# Patient Record
Sex: Female | Born: 1955
Health system: Southern US, Community
[De-identification: ages and names within clinical notes are randomized; demographics above are authoritative.]

## PROBLEM LIST (undated history)

## (undated) DIAGNOSIS — I10 Essential (primary) hypertension: Secondary | ICD-10-CM

## (undated) HISTORY — DX: Essential (primary) hypertension: I10

---

## 1998-11-22 HISTORY — PX: OVARIAN CYST REMOVAL: SHX89

## 2015-02-21 ENCOUNTER — Ambulatory Visit (INDEPENDENT_AMBULATORY_CARE_PROVIDER_SITE_OTHER): Payer: 59 | Admitting: Family Medicine

## 2015-02-21 VITALS — BP 112/68 | HR 58 | Temp 98.2°F | Resp 18 | Ht 60.5 in | Wt 154.0 lb

## 2015-02-21 DIAGNOSIS — I10 Essential (primary) hypertension: Secondary | ICD-10-CM

## 2015-02-21 DIAGNOSIS — J029 Acute pharyngitis, unspecified: Secondary | ICD-10-CM

## 2015-02-21 DIAGNOSIS — Z789 Other specified health status: Secondary | ICD-10-CM | POA: Diagnosis not present

## 2015-02-21 DIAGNOSIS — Z758 Other problems related to medical facilities and other health care: Secondary | ICD-10-CM | POA: Insufficient documentation

## 2015-02-21 LAB — POCT RAPID STREP A (OFFICE): RAPID STREP A SCREEN: NEGATIVE

## 2015-02-21 MED ORDER — BISOPROLOL-HYDROCHLOROTHIAZIDE 5-6.25 MG PO TABS: 1.0000 | ORAL_TABLET | Freq: Every day | ORAL | Status: DC

## 2015-02-21 MED ORDER — PENICILLIN V POTASSIUM 500 MG PO TABS: 500.0000 mg | ORAL_TABLET | Freq: Two times a day (BID) | ORAL | Status: DC

## 2015-02-21 NOTE — Addendum Note (Signed)
Addended by: Abbe AmsterdamOPLAND, JESSICA C on: 02/21/2015 11:47 AM   Modules accepted: Orders

## 2015-02-21 NOTE — Patient Instructions (Signed)
You may have strep throat, although your rapid strep is negative.  We are going to treat you with penicillin (antibiotic) Also use throat lozenges as needed, and take ibuprofen and/ or tylenol Let me know if you do not feel better in the next few days- Sooner if worse.

## 2015-02-21 NOTE — Progress Notes (Addendum)
[facility] [address], [place] [id number] [phone]  Date:  02/21/2015   Name:  Alyssa CannyMARIA R Walker   DOB:  04/26/1956   MRN:  [id number]  PCP:  No PCP Per Patient    Chief Complaint: Headache; Neck Pain; and Otalgia   History of Present Illness:  Alyssa Walker is a 59 y.o. very pleasant female patient who presents with the following:  She notes pain in her head, face and neck for about 3 days.  Her ears have been hurting- sometimes one, sometimes the other.  Also her throat hurts.  She does not have a "headache" in the typical sense but just notes pain in the areas described above No fever, no cough.  No GI symptoms.  No sneezing.   She takes medication for HTN.   Her nose is not really bothering her.  No itchy, runny or stuffy nose  There are no active problems to display for this patient.   Past Medical History  Diagnosis Date  . Hypertension     Past Surgical History  Procedure Laterality Date  . Abdominal hysterectomy    . Coronary artery bypass graft      History  Substance Use Topics  . Smoking status: Never Smoker   . Smokeless tobacco: Not on file  . Alcohol Use: No    History reviewed. No pertinent family history.  No Known Allergies  Medication list has been reviewed and updated.  No current outpatient prescriptions on file prior to visit.   No current facility-administered medications on file prior to visit.    Review of Systems:  As per HPI- otherwise negative.   Physical Examination: Filed Vitals:   02/21/15 1028  BP: 112/68  Pulse: 58  Temp: 98.2 F (36.8 C)  Resp: 18   Filed Vitals:   02/21/15 1028  Height: 5' 0.5" (1.537 m)  Weight: 154 lb (69.854 kg)   Body mass index is 29.57 kg/(m^2). Ideal Body Weight: Weight in (lb) to have BMI = 25: 129.9  GEN: WDWN, NAD, Non-toxic, A & O x 3, overweight, looks well HEENT: Atraumatic, Normocephalic. Neck supple. No masses, No LAD.  Bilateral TM wnl,  oropharynx shows inflamed and enlarged tonsils, blood on strep swab. Nasal cavity WNL.   PEERL,EOMI.   Ears and Nose: No external deformity. CV: RRR, No M/G/R. No JVD. No thrill. No extra heart sounds. PULM: CTA B, no wheezes, crackles, rhonchi. No retractions. No resp. distress. No accessory muscle use. ABD: S, NT, ND EXTR: No c/c/e NEURO Normal gait.  PSYCH: Normally interactive. Conversant. Not depressed or anxious appearing.  Calm demeanor.    Assessment and Plan: Acute pharyngitis, unspecified pharyngitis type - Plan: POCT rapid strep A, penicillin v potassium (VEETID) 500 MG tablet  Suspect strep or other bacterial pharyngitis.  Treat with penicillin, she will let me know if not better in the next couple of days  Signed Abbe AmsterdamJessica Copland, MD  Pharmacy called after visit- pt requesting a RF of her BP medication.  I am not treating her HTN and do not have labs, but will give her one month so she can follow-up with her doctor

## 2015-10-05 ENCOUNTER — Ambulatory Visit (INDEPENDENT_AMBULATORY_CARE_PROVIDER_SITE_OTHER): Payer: 59

## 2015-10-05 ENCOUNTER — Ambulatory Visit (INDEPENDENT_AMBULATORY_CARE_PROVIDER_SITE_OTHER): Payer: 59 | Admitting: Urgent Care

## 2015-10-05 VITALS — BP 140/88 | HR 68 | Temp 98.7°F | Resp 14 | Ht 60.0 in | Wt 153.0 lb

## 2015-10-05 DIAGNOSIS — M545 Low back pain: Secondary | ICD-10-CM

## 2015-10-05 MED ORDER — CYCLOBENZAPRINE HCL 10 MG PO TABS: 5.0000 mg | ORAL_TABLET | Freq: Every day | ORAL | Status: DC

## 2015-10-05 MED ORDER — MELOXICAM 15 MG PO TABS: 7.5000 mg | ORAL_TABLET | Freq: Every day | ORAL | Status: DC

## 2015-10-05 NOTE — Patient Instructions (Signed)
Dolor de espalda en adultos  (Back Pain, Adult)  El dolor de espalda es muy frecuente en los adultos. La causa del dolor de espalda es rara vez peligrosa y el dolor a menudo mejora con el tiempo. Es posible que se desconozca la causa de esta afección. Algunas causas comunes son las siguientes:  · Distensión de los músculos o ligamentos que sostienen la columna vertebral.  · Desgaste (degeneración) de los discos vertebrales.  · Artritis.  · Lesiones directas en la espalda.  En muchas personas, el dolor de espalda es recurrente. Como rara vez es peligroso, las personas pueden aprender a manejar esta afección por sí mismas.  INSTRUCCIONES PARA EL CUIDADO EN EL HOGAR  Controle su dolor de espalda a fin de detectar algún cambio. Las siguientes indicaciones ayudarán a aliviar cualquier molestia que pueda sentir:  · Permanezca activo. Si permanece sentado o de pie en un mismo lugar durante mucho tiempo, se tensiona la espalda. No se siente, conduzca o permanezca de pie en un mismo lugar durante más de 30 minutos seguidos. Realice caminatas cortas en superficies planas tan pronto como le sea posible. Trate de caminar un poco más de tiempo cada día.  · Haga ejercicio regularmente como se lo haya indicado el médico. El ejercicio ayuda a que su espalda se cure más rápidamente. También ayuda a prevenir futuras lesiones al mantener los músculos fuertes y flexibles.  · No permanezca en la cama. Si hace reposo más de 1 a 2 días, puede demorar su recuperación.  · Preste atención a su cuerpo al inclinarse y levantarse. Las posiciones más cómodas son las que ejercen menos tensión en la espalda en recuperación. Siempre use técnicas apropiadas para levantar objetos, como por ejemplo:    Flexionar las rodillas.    Mantener la carga cerca del cuerpo.    No torcerse.  · Encuentre una posición cómoda para dormir. Use un colchón firme y recuéstese de costado con las rodillas ligeramente flexionadas. Si se recuesta sobre la espalda, coloque  una almohada debajo de las rodillas.  · Evite sentir ansiedad o estrés. El estrés aumenta la tensión muscular y puede empeorar el dolor de espalda. Es importante reconocer si se siente ansioso o estresado y aprender maneras de controlarlo, por ejemplo haciendo ejercicio.  · Tome los medicamentos solamente como se lo haya indicado el médico. Los medicamentos de venta libre para aliviar el dolor y la inflamación a menudo son los más eficaces. El médico puede recetarle relajantes musculares. Estos medicamentos ayudan a calmar el dolor de modo que pueda reanudar más rápidamente sus actividades normales y el ejercicio saludable.  · Aplique hielo sobre la zona lesionada.    Ponga el hielo en una bolsa plástica.    Coloque una toalla entre la piel y la bolsa de hielo.    Deje el hielo durante 20 minutos, 2 a 3 veces por día, durante los primeros 2 o 3 días. Después de eso, puede alternar el hielo y el calor para reducir el dolor y los espasmos.  · Mantenga un peso saludable. El exceso de peso ejerce presión adicional sobre la espalda y hace que resulte difícil mantener una buena postura.  SOLICITE ATENCIÓN MÉDICA SI:  · Siente un dolor que no se alivia con reposo o medicamentos.  · Siente mucho dolor que se extiende a las piernas o los glúteos.  · El dolor no mejora en una semana.  · Siente dolor por la noche.  · Pierde peso.  · Siente escalofríos o fiebre.  SOLICITE ATENCIÓN MÉDICA DE INMEDIATO SI:   ·   Tiene nuevos problemas para controlar la vejiga o los intestinos.  · Siente debilidad o adormecimiento inusuales en los brazos o en las piernas.  · Siente náuseas o vómitos.  · Siente dolor abdominal.  · Siente que va a desmayarse.     Esta información no tiene como fin reemplazar el consejo del médico. Asegúrese de hacerle al médico cualquier pregunta que tenga.     Document Released: 11/08/2005 Document Revised: 11/29/2014  Elsevier Interactive Patient Education ©2016 Elsevier Inc.

## 2015-10-05 NOTE — Progress Notes (Signed)
    MRN: [id number] DOB: 03/09/1956  Subjective:   Alyssa Walker is a 59 y.o. female presenting for chief complaint of Back Pain  Reports several year history of intermittent low back pain. Pain is achy and intermittently sharp, now occuring daily, worse in the mornings, radiates down her right leg. Has previously received injections in her low back with significant relief. Lost follow up with the physician who was doing this for her so she has been using Advil lately without any relief. Patient works in Dietitiancleaning services. Denies fever, numbness or tingling, incontinence, trauma, constipation. Denies any other aggravating or relieving factors, no other questions or concerns.  Alyssa Walker has a current medication list which includes the following prescription(s): bisoprolol-hydrochlorothiazide and penicillin v potassium. Also has No Known Allergies.  Alyssa Walker  has a past medical history of Hypertension. Also  has past surgical history that includes Abdominal hysterectomy and Coronary artery bypass graft.  Objective:   Vitals: BP 140/88 mmHg  Pulse 68  Temp(Src) 98.7 F (37.1 C) (Oral)  Resp 14  Ht 5' (1.524 m)  Wt 153 lb (69.4 kg)  BMI 29.88 kg/m2  SpO2 97%  Physical Exam  Constitutional: She is oriented to person, place, and time. She appears well-developed and well-nourished.  Cardiovascular: Normal rate.   Pulmonary/Chest: Effort normal.  Abdominal: Soft. Bowel sounds are normal. She exhibits no distension and no mass. There is no tenderness.  Musculoskeletal:       Lumbar back: She exhibits decreased range of motion (extension), tenderness (at midline as depicted and over right upper gluteus) and spasm. She exhibits no bony tenderness, no swelling, no edema, no deformity and no laceration.       Back:  Neurological: She is alert and oriented to person, place, and time.  Skin: Skin is warm and dry. No rash noted. No erythema. No pallor.   [facility] reading (PRIMARY) by  Dr. Cleta Albertsaub and  PA-Ripley Bogosian. Lumbar - Degenerative changes in facets at L5-S1 which may be appropriate for her age. No acute process.  Dg Lumbar Spine Complete  10/05/2015  CLINICAL DATA:  59 year old female with lumbar spine pain. EXAM: LUMBAR SPINE - COMPLETE 4+ VIEW COMPARISON:  None. FINDINGS: Five non rib-bearing lumbar type vertebra are identified in normal alignment. There is no evidence of acute fracture or subluxation. Mild multilevel degenerative disc disease noted. No focal bony lesions or spondylolysis noted. Cholecystectomy clips are present. IMPRESSION: Mild multilevel degenerative disc disease without other significant abnormality. Electronically Signed   By: Harmon PierJeffrey  Hu M.D.   On: 10/05/2015 09:44   Assessment and Plan :   1. Low back pain, unspecified back pain laterality, with sciatica presence unspecified - Counseled on early signs of arthritis, start meloxicam and flexeril for pain and inflammation. Consider referral to ortho for further management.  Wallis BambergMario Kennadi Albany, PA-C [facility] [phone] 10/05/2015 9:02 AM

## 2015-11-02 ENCOUNTER — Ambulatory Visit (INDEPENDENT_AMBULATORY_CARE_PROVIDER_SITE_OTHER): Payer: 59 | Admitting: Urgent Care

## 2015-11-02 ENCOUNTER — Encounter: Payer: Self-pay | Admitting: Urgent Care

## 2015-11-02 VITALS — BP 148/90 | HR 65 | Temp 97.8°F | Ht 60.0 in | Wt 150.0 lb

## 2015-11-02 DIAGNOSIS — M51369 Other intervertebral disc degeneration, lumbar region without mention of lumbar back pain or lower extremity pain: Secondary | ICD-10-CM

## 2015-11-02 DIAGNOSIS — I10 Essential (primary) hypertension: Secondary | ICD-10-CM

## 2015-11-02 DIAGNOSIS — M542 Cervicalgia: Secondary | ICD-10-CM

## 2015-11-02 DIAGNOSIS — R0789 Other chest pain: Secondary | ICD-10-CM

## 2015-11-02 DIAGNOSIS — M5136 Other intervertebral disc degeneration, lumbar region: Secondary | ICD-10-CM

## 2015-11-02 DIAGNOSIS — M62830 Muscle spasm of back: Secondary | ICD-10-CM

## 2015-11-02 LAB — COMPREHENSIVE METABOLIC PANEL
ALK PHOS: 59 U/L (ref 33–130)
ALT: 19 U/L (ref 6–29)
AST: 17 U/L (ref 10–35)
Albumin: 4 g/dL (ref 3.6–5.1)
BUN: 17 mg/dL (ref 7–25)
CHLORIDE: 108 mmol/L (ref 98–110)
CO2: 27 mmol/L (ref 20–31)
Calcium: 9.1 mg/dL (ref 8.6–10.4)
Creat: 0.59 mg/dL (ref 0.50–1.05)
GLUCOSE: 99 mg/dL (ref 65–99)
POTASSIUM: 4 mmol/L (ref 3.5–5.3)
Sodium: 140 mmol/L (ref 135–146)
Total Bilirubin: 0.4 mg/dL (ref 0.2–1.2)
Total Protein: 7 g/dL (ref 6.1–8.1)

## 2015-11-02 LAB — LIPID PANEL
CHOL/HDL RATIO: 3.8 ratio (ref <=5.0)
CHOLESTEROL: 173 mg/dL (ref 125–200)
HDL: 45 mg/dL — ABNORMAL LOW (ref >=46)
LDL Cholesterol: 100 mg/dL (ref <130)
Triglycerides: 142 mg/dL (ref <150)
VLDL: 28 mg/dL (ref <30)

## 2015-11-02 MED ORDER — HYDROCHLOROTHIAZIDE 12.5 MG PO TABS: 12.5000 mg | ORAL_TABLET | Freq: Every day | ORAL | Status: DC

## 2015-11-02 NOTE — Patient Instructions (Addendum)
Osteoartritis (Osteoarthritis) La osteoartritis es una enfermedad que provoca dolor e inflamacin en las articulaciones. Ocurre cuando el cartlago de la articulacin afectada se desgasta. El cartlago acta como una almohadilla que cubre los extremos de los huesos que forman una articulacin. La osteoartritis es la ms frecuente de reumatismo articular. Afecta a menudo a los ancianos. Las articulaciones que se ven ms afectadas por esta afeccin son las que se encuentran en las siguientes zonas:  Los extremos de los dedos.  Los pulgares.  El cuello.  La parte inferior de la espalda.  Las rodillas.  Las caderas CAUSAS  Con el paso del [place], el cartlago que recubre los extremos de los huesos comienza a IT sales professional. Esto provoca friccin Monsanto Company, lo que causa dolor y entumecimiento en las articulaciones afectadas.  [place] probabilidades de padecer osteoartritis, incluidos los siguientes:  Edad avanzada.  Exceso de Engineer, site.  Uso excesivo de la articulacin.  Lesin previa en la articulacin. [place] y entumecimiento en la articulacin.  Con el tiempo, la articulacin pierde su forma normal.  Pueden formarse pequeos depsitos de hueso (ostefitos) en los extremos de Water engineer.  Algunos trozos de Prax[employer] o cartlago pueden separarse y flotar dentro del espacio de la articulacin. Esto puede causar ms dolor y lesiones. DIAGNSTICO  El mdico le preguntar acerca de sus sntomas y le har un examen fsico. Le indicarn varios estudios, como:  Radiografas de Counselling psychologist.  Anlisis de sangre para descartar otros tipos de artritis. Pueden usarse pruebas adicionales para diagnosticar la enfermedad. TRATAMIENTO  Los [employer] del tratamiento son Financial controller y mejorar el funcionamiento de Water engineer. Los planes de tratamiento pueden incluir lo siguiente:  Un  programa de ejercicios recomendado que permita el descanso y el alivio de la articulacin.  Un plan de control del peso.  Tcnicas de UnumProvident, como las siguientes:  Aplicacin correcta de fro y Freight forwarder.  Impulsos elctricos enviados a las terminaciones nerviosas que se encuentran debajo de la piel (neuroestimulacin elctrica transcutnea [TENS]).  Masajes.  Ciertos suplementos nutricionales.  Medicamentos para Financial controller como:  Paracetamol.  Antiinflamatorios no esteroides (AINE), como el naproxeno.  Narcticos o agentes de accin central, como el tramadol.  Corticoides. Estos se pueden administrar por va oral o mediante una inyeccin.  [place] para reposicionar los Affiliated Computer Services y Best boy (osteotoma) o para retirar las piezas sueltas de hueso y Database administrator. Puede ser necesario el reemplazo de las articulaciones en estadios avanzados de la enfermedad. Colerain los medicamentos solamente como se lo haya indicado el mdico.  Mantenga un peso saludable. Siga las instrucciones del mdico con respecto al control del [place]. Esto puede incluir instrucciones Recruitment consultant.  Practique los ejercicios que le indiquen. Es posible que el mdico le recomiende tipos especficos de ejercicios. Estos pueden incluir los siguientes:  Ejercicios de fortalecimiento Se realizan para fortalecer los msculos que sostienen las articulaciones afectadas por la artritis. Pueden realizarse con peso o con bandas para agregar resistencia.  Actividades Precious Haws. Son Clinical research associate a paso ligero, gimnasia [place] de bajo impacto, que acelere el corazn.  Actividades de amplitud de movimientos. Dan agilidad a las articulaciones.  Ejercicios de equilibrio y [place]. Ayudan a Advanced Micro Devices se necesitan para la vida diaria.  Haga descansar a las articulaciones segn las indicaciones del mdico.  Concurra a todas las  visitas de  control como se lo haya indicado el mdico. SOLICITE ATENCIN MDICA SI:   La piel se pone roja.  Aparece una erupcin adems del dolor en la articulacin.  El dolor en la articulacin empeora.  Tiene fiebre y siente dolor en la articulacin o el msculo. SOLICITE ATENCIN MDICA DE INMEDIATO SI:  Nota una prdida importante de peso o del apetito.  Tiene transpiracin nocturna. PARA Parthenia AmesBTENER MS INFORMACIN   The Krogernstituto Nacional de Artritis y Event organisernfermedades Musculoesquelticas y Dermatolgicas [facility](National Institute of Arthritis and Musculoskeletal and Skin Diseases): www.niams.http://www.myers.net/nih.gov.  Instituto Lockheed Martinacional sobre el Envejecimiento (General Millsational Institute on Aging): https://walker.com/www.nia.nih.gov.  Instituto Norteamericano de Advice workereumatologa (American College of Rheumatology): www.rheumatology.org.   Esta informacin no tiene como fin reemplazar el consejo del mdico. Asegrese de hacerle al mdico cualquier pregunta que tenga.   Document Released: 08/18/2005 Document Revised: 11/29/2014 Elsevier Interactive Patient Education 2016 ArvinMeritorElsevier Inc.    Dolor de pecho inespecfico  (Nonspecific Chest Pain) El dolor de pecho puede deberse a muchas enfermedades diferentes. Siempre existe una posibilidad de que el dolor est relacionado con algo grave, como un infarto de miocardio o un cogulo sanguneo en los pulmones. Hay muchas enfermedades que no son potencialmente mortales que pueden causar dolor de [place] Fall[place]cho. Si tiene Engineer, miningdolor de Hotel managerpecho, es muy importante que se controle con el mdico. CAUSAS  Las causas del dolor de pecho pueden ser las siguientes:  Acidez estomacal.  Neumona o bronquitis.  Ansiedad o estrs.  Inflamacin de la zona que rodea al corazn (pericarditis) o a los pulmones (pleuritis o pleuresa).  Un cogulo sanguneo en el pulmn.  Colapso de un pulmn (neumotrax), que puede aparecer de Regions Financial Corporationmanera repentina por s solo (neumotrax espontneo) o debido a un traumatismo en el trax.  Culebrilla  (virus de la varicela zster).  Infarto de miocardio.  Dao de los Colphuesos, los msculos y los cartlagos que conforman la pared torcica. Esto puede incluir lo siguiente:  Hematomas seos debido a lesiones.  Distensiones musculares o de los cartlagos por tos frecuente o repetida, o por exceso de trabajo.  Fractura de una o ms costillas.  Dolor de TEFL teachercartlago debido a inflamacin (costocondritis). FACTORES DE RIESGO  Los factores de riesgo de tener dolor de pecho pueden incluir lo siguiente:  Actividades que incrementan el riesgo de sufrir traumatismos o lesiones en el trax.  Infecciones o enfermedades respiratorias que causan tos frecuente.  Enfermedades o Eastman Kodakexcesos en las comidas que pueden causar Engineering geologistacidez.  Enfermedades cardacas o antecedentes familiares de enfermedades cardacas.  Enfermedades o comportamientos de salud que aumentan el riesgo de tener un cogulo sanguneo.  Haber tenido varicela (varicela zster). SIGNOS Y SNTOMAS El dolor de pecho puede provocar las siguientes sensaciones:  Ardor u hormigueo en la superficie o en lo profundo del pecho.  Dolor opresivo, continuo o constrictivo.  Dolor vago o intenso que empeora al Clorox Companymoverse, toser o inhalar profundamente.  Dolor que tambin se siente en la espalda, el cuello, el hombro o el brazo, o dolor que se irradia a cualquiera de estas zonas. El dolor de pecho puede aparecer y Geneticist, moleculardesaparecer, o bien puede ser constante. DIAGNSTICO Gretchen ShortQuizs se necesiten anlisis de laboratorio u otros estudios para Veterinary surgeonencontrar la causa del Engineer, miningdolor. El mdico puede indicarle que se haga una prueba llamada EGC (electrocadiograma) ambulatorio. El Regulatory affairs officerelectrocardiograma registra los patrones de los latidos cardacos en el momento en que se realiza el Steilacoomestudio. Tambin pueden hacerle otros estudios, por ejemplo:  Ecocardiograma transtorcico (ETT). Durante el ecocardiograma, se usan ondas sonoras para  crear Neomia Dear imagen de todas las estructuras cardacas y  evaluar cmo circula la sangre por el corazn.  Ecocardiograma transesofgico (ETE).Este es un estudio de diagnstico por imgenes ms avanzado que el obtiene imgenes del interior del cuerpo. Le permite al mdico ver el corazn con mayor detalle.  Monitoreo cardaco. Permite que el mdico controle la frecuencia y el ritmo cardaco en tiempo real.  Monitor Holter. Es un dispositivo porttil que eBay latidos del corazn y puede ayudar a Education administrator las arritmias cardacas. Le permite al American Express registrar la actividad cardaca durante varios das, si es necesario.  Pruebas de esfuerzo. Estas pueden realizarse durante el ejercicio o mediante la administracin de un medicamento que acelera los latidos del corazn.  Anlisis de [place].  Diagnstico por imgenes. TRATAMIENTO  El tratamiento depende de la causa del dolor de [place]. El tratamiento puede incluir lo siguiente:  Medicamentos. Estos pueden incluir lo siguiente:  Inhibidores de Publishing copy.  Antiinflamatorios.  Analgsicos para las enfermedades inflamatorias.  Antibiticos, si hay una infeccin.  Medicamentos para Northwest Airlines.  Medicamentos para tratar la enfermedad arterial coronaria.  Tratamiento complementario para las enfermedades que no requieren la toma de medicamentos. Esto puede incluir lo siguiente:  Descansar.  Aplicar compresas fras o calientes en las zonas lesionadas.  Limitar las actividades hasta que Erie Insurance Group. INSTRUCCIONES PARA EL CUIDADO EN EL HOGAR  Si le recetaron antibiticos, asegrese de terminarlos, incluso si comienza a sentirse mejor.  Evite las SUPERVALU INC causen dolor de [place].  No consuma ningn producto que contenga tabaco, lo que incluye cigarrillos, tabaco de Theatre manager o Administrator, Civil Service. Si necesita ayuda para dejar de fumar, consulte al mdico.  No beba alcohol.  Tome los medicamentos solamente como se lo haya indicado el  mdico.  Concurra a todas las visitas de control como se lo haya indicado el mdico. Esto es importante. Esto incluye otros estudios si el dolor de pecho no desaparece.  Si la acidez es la causa del dolor de [place], tal vez le aconsejen que mantenga la cabeza levantada (elevada) mientras duerme. Esto reduce la probabilidad de que el cido retroceda del estmago al esfago.  Haga cambios en su estilo de vida como se lo haya indicado el mdico. Estos pueden incluir lo siguiente:  Education administrator actividad fsica con regularidad. Pida al mdico que le sugiera algunas actividades que sean seguras para usted.  Consumir una dieta cardiosaludable. Un nutricionista matriculado puede ayudarlo a Software engineer saludables.  Mantener un peso saludable.  Controlar la diabetes, si es necesario.  Reducir las situaciones de estrs. SOLICITE ATENCIN MDICA SI:  El dolor de pecho no desaparece despus del tratamiento.  Tiene una erupcin cutnea con ampollas en el pecho.  Tiene fiebre. SOLICITE ATENCIN MDICA DE INMEDIATO SI:   El dolor en el pecho es ms intenso.  La tos empeora, o expectora sangre.  Siente un dolor abdominal intenso.  Siente debilidad intensa.  Se desmaya.  Tiene escalofros.  Tiene una molestia repentina e inexplicable en el pecho.  Tiene molestias repentinas e Exxon Mobil Corporation, la espalda, el cuello o la [place].  Le falta el aire en cualquier momento.  Comienza a sudar de [place] repentina o la piel se le humedece.  Siente nuseas o vomita.  Se siente repentinamente mareado o se desmaya.  Siente que el corazn comienza a latir rpidamente o que se saltea latidos. Estos sntomas pueden representar un problema grave que constituye Radio broadcast assistant. No espere hasta que los sntomas  desaparezcan. Solicite atencin mdica de inmediato. Comunquese con el servicio de emergencias de su localidad (911 en los [place]). No conduzca por sus propios medios  OfficeMax Incorporated.   Esta informacin no tiene Theme park manager el consejo del mdico. Asegrese de hacerle al mdico cualquier pregunta que tenga.   Document Released: 11/08/2005 Document Revised: 11/29/2014 Elsevier Interactive Patient Education [employer]

## 2015-11-02 NOTE — Progress Notes (Signed)
    MRN: [id number] DOB: 01/04/1956  Subjective:   Alyssa Walker is a 59 y.o. female presenting for chief complaint of Follow-up and Back Pain  Back pain - reports that she is significantly improved with her back pain. However, in the past 3 days, she has had left sided neck pain, left shoulder/trapezius pain, achy chest pain with right arm numbness. Her pain and numbness is worse with increased activity at work, does house cleaning. She has not tried any medications for relief including meloxicam and Flexeril. She denies history of heart disease, shob, chest tightness, n/v, abdominal pain, lower leg swelling, diaphoresis, jaw pain. She does have HTN, has not been taking her medications because she ran out. She is not a smoker, does not drink alcohol.  Alyssa Walker has a current medication list which includes the following prescription(s): cyclobenzaprine, meloxicam, and bisoprolol-hydrochlorothiazide. Also has No Known Allergies.  Alyssa Walker  has a past medical history of Hypertension. Also  has past surgical history that includes Abdominal hysterectomy and Coronary artery bypass graft.  Objective:   Vitals: BP 148/90 mmHg  Pulse 65  Temp(Src) 97.8 F (36.6 C) (Oral)  Ht 5' (1.524 m)  Wt 150 lb (68.04 kg)  BMI 29.30 kg/m2  SpO2 97%  Physical Exam  Constitutional: She is oriented to person, place, and time. She appears well-developed and well-nourished.  HENT:  Mouth/Throat: Oropharynx is clear and moist.  Eyes: Right eye exhibits no discharge. Left eye exhibits no discharge. No scleral icterus.  Cardiovascular: Normal rate, regular rhythm and intact distal pulses.  Exam reveals no gallop and no friction rub.   No murmur heard. Pulmonary/Chest: No respiratory distress. She has no wheezes. She has no rales. She exhibits no tenderness.  Abdominal: Soft. Bowel sounds are normal. She exhibits no distension and no mass. There is no tenderness.  Musculoskeletal: She exhibits no edema.       Right  shoulder: She exhibits normal range of motion, no tenderness, no bony tenderness, no swelling, no effusion, no crepitus, no deformity, no spasm and normal strength.       Cervical back: She exhibits tenderness (over area depicted) and spasm (over area depicted). She exhibits normal range of motion, no bony tenderness, no swelling, no edema, no deformity and no laceration.       Back:  Neurological: She is alert and oriented to person, place, and time.  Skin: Skin is warm and dry. No rash noted. No erythema. No pallor.   ECG interpretation - normal sinus rhythm.  Assessment and Plan :   1. Essential hypertension 2. Atypical chest pain - Physical exam, ECG reassuring. For now, I do not suspect that her atypical chest pain is cardiac related. Patient is to restart BP meds. I will provide script for HCT. F/u in 6 months.  3. Degenerative disc disease, lumbar 4. Neck pain 5. Spasm of back muscles - Recommended she take meloxicam and Flexeril at night. Rtc if pain persists despite NSAID and flexeril treatment. Consider referral to ortho or short steroid course.  Wallis BambergMario Johna Kearl, PA-C [facility] [phone] 11/02/2015 9:03 AM

## 2016-02-13 ENCOUNTER — Ambulatory Visit (INDEPENDENT_AMBULATORY_CARE_PROVIDER_SITE_OTHER): Payer: BLUE CROSS/BLUE SHIELD | Admitting: Internal Medicine

## 2016-02-13 VITALS — BP 122/82 | HR 72 | Temp 99.0°F | Resp 16 | Ht 60.0 in | Wt 153.0 lb

## 2016-02-13 DIAGNOSIS — M62830 Muscle spasm of back: Secondary | ICD-10-CM | POA: Diagnosis not present

## 2016-02-13 DIAGNOSIS — M545 Low back pain: Secondary | ICD-10-CM | POA: Diagnosis not present

## 2016-02-13 MED ORDER — DICLOFENAC SODIUM 75 MG PO TBEC: 75.0000 mg | DELAYED_RELEASE_TABLET | Freq: Two times a day (BID) | ORAL | Status: DC

## 2016-02-13 MED ORDER — TIZANIDINE HCL 4 MG PO CAPS: 4.0000 mg | ORAL_CAPSULE | Freq: Three times a day (TID) | ORAL | Status: DC | PRN

## 2016-02-13 NOTE — Patient Instructions (Signed)
Ejercicios para la espalda (Back Exercises) Los siguientes ejercicios fortalecen los msculos que dan soporte a la espalda y, adems, ayudan a mantener la flexibilidad de la zona lumbar. Hacer estos ejercicios puede ser de ayuda para evitar el dolor de espalda o aliviar el dolor actual. Si tiene dolor o molestias en la espalda, intente hacer estos ejercicios 2 o 3veces por da, o como se lo haya indicado el mdico. Cuando el dolor desaparezca, hgalos una vez por da, pero haga ms repeticiones de cada ejercicio. Si no tiene dolor o molestias en la espalda, haga estos ejercicios una vez por da o como se lo haya indicado el mdico. EJERCICIOS Rodilla al pecho Repita estos pasos 3 o 5veces con cada pierna: 1. Acustese boca arriba sobre una cama dura o sobre el suelo con las piernas extendidas. 2. Lleve una rodilla al pecho. La otra pierna debe quedar extendida y en contacto con el suelo. 3. Mantenga la rodilla contra el pecho. Para lograrlo tmese la rodilla o el muslo. 4. Tire de la rodilla hasta sentir una elongacin suave en la parte baja de la espalda. 5. Mantenga la elongacin durante 10 a 30segundos. 6. Suelte y extienda la pierna lentamente. Inclinacin de la pelvis Repita estos pasos 5 o 10veces: 1. Acustese boca arriba sobre una cama dura o sobre el suelo con las piernas extendidas. 2. Flexione las rodillas de modo que apunten al techo y los pies queden apoyados en el suelo. 3. Contraiga los msculos de la parte baja del abdomen para empujar la zona lumbar contra el suelo. Con este movimiento se inclinar la pelvis de modo que el cccix apunte hacia el techo, en lugar de apuntar en direccin a los pies o al suelo. 4. Contraiga suavemente y respire con normalidad mientras mantiene esta posicin durante 5 a 10segundos. El perro y el gato Repita estos pasos hasta que la zona lumbar se vuelva ms flexible: 1. Apoye las palmas de las manos y las rodillas sobre una superficie firme. Las  manos deben estar alineadas con los hombros y las rodillas con las caderas. Puede colocarse almohadillas debajo de las rodillas para estar cmodo. 2. Deje caer la cabeza y baje el cccix en direccin al suelo de modo que la zona lumbar se arquee como el lomo de un gato asustado. 3. Mantenga esta posicin durante 5segundos. 4. Lentamente, levante la cabeza y eleve el cccix de modo que apunte en direccin al techo para que la espalda forme un arco hundido como el lomo de un perro contento. 5. Mantenga esta posicin durante 5segundos. Flexiones de brazos Repita estos pasos 5 o 10veces: 1. Acustese boca abajo en el suelo. 2. Coloque las palmas de las manos cerca de la cabeza, separadas aproximadamente al ancho de los hombros. 3. Con la espalda lo ms relajada posible y las caderas apoyadas en el suelo, extienda lentamente los brazos para levantar la mitad superior del cuerpo y elevar los hombros. No use los msculos de la espalda para elevar la parte superior del torso. Puede cambiar las manos de lugar para estar ms cmodo. 4. Mantenga esta posicin durante 5segundos mientras mantiene la espalda relajada. 5. Lentamente vuelva a la posicin horizontal. Puentes Repita estos pasos 10veces: 1. Acustese boca arriba sobre una superficie firme. 2. Flexione las rodillas de modo que apunten al techo y los pies queden apoyados en el suelo. 3. Contraiga los glteos y despegue las nalgas del suelo hasta que la cintura est casi a la misma altura que las rodillas. Debe   sentir el trabajo muscular en las nalgas y la parte posterior de los muslos. Si no siente el esfuerzo de [employer], aleje los pies 1 o 2pulgadas (2,5 o 5centmetros) de las nalgas. 4. Mantenga esta posicin durante 3 a 5segundos. 5. Baje lentamente las caderas a la posicin inicial y relaje los glteos por completo. Si este ejercicio le resulta muy fcil, intente realizarlo con los brazos cruzados [place]. Abdominales Repita estos pasos 5 o 10veces: 1. Acustese boca arriba sobre una cama dura o sobre el suelo con las piernas extendidas. 2. Flexione las rodillas de modo que apunten al techo y los pies queden apoyados en el suelo. 3. Cruce los [employer]. 4. Baje levemente el mentn en direccin al pecho sin doblar el cuello. 5. Contraiga los msculos del abdomen y con lentitud eleve el torso lo suficiente como para Artist los omplatos del suelo. No eleve el torso ms alto que eso, porque esto puede sobreexigir a la zona lumbar y no ayuda a Investment banker, operational. 6. Regrese lentamente a la posicin inicial. Elevaciones de espalda Repita estos pasos 5 o 10veces: 1. Acustese boca abajo con los brazos a los costados del cuerpo y apoye la frente en el suelo. 2. Contraiga los msculos de las piernas y los glteos. 3. Lentamente despegue el pecho del suelo [employer] las caderas bien apoyadas en el suelo. Mantenga la nuca alineada con la curvatura de la espalda. Los ojos deben mirar al suelo. 4. Mantenga esta posicin durante 3 a 5segundos. 5. Regrese lentamente a la posicin inicial. SOLICITE ATENCIN MDICA SI:  El dolor o las molestias en la espalda se vuelven mucho ms intensos cuando hace un ejercicio.  El dolor o las molestias en la espalda no se Copy en el trmino de las 2horas posteriores a Copy. Si tiene alguno de [employer], deje de [employer] ejercicios de inmediato. No vuelva a hacer los ejercicios a menos que el mdico lo autorice. SOLICITE ATENCIN MDICA DE INMEDIATO SI:  Siente un dolor sbito e intenso en la espalda. Si esto ocurre, deje de [employer] ejercicios de inmediato. No vuelva a hacer los ejercicios a menos que el mdico lo autorice.   Esta informacin no tiene Theme park manager el consejo del mdico. Asegrese de hacerle al mdico cualquier pregunta que tenga.   Document Released: 11/08/2005  Document Revised: 07/30/2015 Elsevier Interactive Patient Education 2016 Elsevier Inc. Back Exercises The following exercises strengthen the muscles that help to support the back. They also help to keep the lower back flexible. Doing these exercises can help to prevent back pain or lessen existing pain. If you have back pain or discomfort, try doing these exercises 2-3 times each day or as told by your health care provider. When the pain goes away, do them once each day, but increase the number of times that you repeat the steps for each exercise (do more repetitions). If you do not have back pain or discomfort, do these exercises once each day for the rest of your life!!!!!! EXERCISES Single Knee to Chest Repeat these steps 3-5 times for each leg: 7. Lie on your back on a firm bed or the floor with your legs extended. 8. Bring one knee to your chest. Your other leg should stay extended and in contact with the floor. 9. Hold your knee in place by grabbing your knee or thigh. 10. Pull on your knee until you feel a gentle stretch in  your lower back. 11. Hold the stretch for 10-30 seconds. 12. Slowly release and straighten your leg. Pelvic Tilt Repeat these steps 5-10 times: 5. Lie on your back on a firm bed or the floor with your legs extended. 6. Bend your knees so they are pointing toward the ceiling and your feet are flat on the floor. 7. Tighten your lower abdominal muscles to press your lower back against the floor. This motion will tilt your pelvis so your tailbone points up toward the ceiling instead of pointing to your feet or the floor. 8. With gentle tension and even breathing, hold this position for 5-10 seconds. Cat-Cow Repeat these steps until your lower back becomes more flexible: 6. Get into a hands-and-knees position on a firm surface. Keep your hands under your shoulders, and keep your knees under your hips. You may place padding under your knees for comfort. 7. Let your head  hang down, and point your tailbone toward the floor so your lower back becomes rounded like the back of a cat. 8. Hold this position for 5 seconds. 9. Slowly lift your head and point your tailbone up toward the ceiling so your back forms a sagging arch like the back of a cow. 10. Hold this position for 5 seconds. Press-Ups Repeat these steps 5-10 times: 6. Lie on your abdomen (face-down) on the floor. 7. Place your palms near your head, about shoulder-width apart. 8. While you keep your back as relaxed as possible and keep your hips on the floor, slowly straighten your arms to raise the top half of your body and lift your shoulders. Do not use your back muscles to raise your upper torso. You may adjust the placement of your hands to make yourself more comfortable. 9. Hold this position for 5 seconds while you keep your back relaxed. 10. Slowly return to lying flat on the floor. Bridges Repeat these steps 10 times: 6. Lie on your back on a firm surface. 7. Bend your knees so they are pointing toward the ceiling and your feet are flat on the floor. 8. Tighten your buttocks muscles and lift your buttocks off of the floor until your waist is at almost the same height as your knees. You should feel the muscles working in your buttocks and the back of your thighs. If you do not feel these muscles, slide your feet 1-2 inches farther away from your buttocks. 9. Hold this position for 3-5 seconds. 10. Slowly lower your hips to the starting position, and allow your buttocks muscles to relax completely. If this exercise is too easy, try doing it with your arms crossed over your chest. Abdominal Crunches Repeat these steps 5-10 times: 7. Lie on your back on a firm bed or the floor with your legs extended. 8. Bend your knees so they are pointing toward the ceiling and your feet are flat on the floor. 9. Cross your arms over your chest. 10. Tip your chin slightly toward your chest without bending your  neck. 11. Tighten your abdominal muscles and slowly raise your trunk (torso) high enough to lift your shoulder blades a tiny bit off of the floor. Avoid raising your torso higher than that, because it can put too much stress on your low back and it does not help to strengthen your abdominal muscles. 12. Slowly return to your starting position. Back Lifts Repeat these steps 5-10 times: 6. Lie on your abdomen (face-down) with your arms at your sides, and rest your forehead on the  floor. 7. Tighten the muscles in your legs and your buttocks. 8. Slowly lift your chest off of the floor while you keep your hips pressed to the floor. Keep the back of your head in line with the curve in your back. Your eyes should be looking at the floor. 9. Hold this position for 3-5 seconds. 10. Slowly return to your starting position. SEEK MEDICAL CARE IF:  Your back pain or discomfort gets much worse when you do an exercise.  Your back pain or discomfort does not lessen within 2 hours after you exercise. If you have any of these problems, stop doing these exercises right away. Do not do them again unless your health care provider says that you can. SEEK IMMEDIATE MEDICAL CARE IF:  You develop sudden, severe back pain. If this happens, stop doing the exercises right away. Do not do them again unless your health care provider says that you can.   This information is not intended to replace advice given to you by your health care provider. Make sure you discuss any questions you have with your health care provider.   Document Released: 12/16/2004 Document Revised: 07/30/2015 Document Reviewed: 01/02/2015 Elsevier Interactive Patient Education [employer]

## 2016-02-13 NOTE — Progress Notes (Signed)
Subjective:  By signing my name below, I, Stann Oresung-Kai Tsai, attest that this documentation has been prepared under the direction and in the presence of Ellamae Siaobert Ladana Chavero, MD. Electronically Signed: Stann Oresung-Kai Tsai, Scribe. 02/13/2016 , 10:18 AM .  Patient was seen in Room 5 .   Patient ID: Alyssa Walker, female    DOB: 05/05/1956, 60 y.o.   MRN: [id number] Chief Complaint  Patient presents with  . Back Pain    lower, x 1 month    HPI Alyssa Walker is a 60 y.o. female who has h/o HTN [facility] lower back pain that worsened in the past month. Patient states that she was seen here in Dec 2016. At that time, she was seen by Wallis BambergMario Mani, PA-C. She was recommended to take meloxicam and Flexeril at night.   She had L-spine xray done back in Nov 2016:  FINDINGS: Five non rib-bearing lumbar type vertebra are identified in normal alignment, There is no evidence of acute fracture or subluxation, mild multilevel degenerative disc disease noted, nocal bony lesions or spondylolysis noted, cholecystectomy clips are present.  IMPRESSION: Mild multilevel degenerative disc disease without other significant abnormality.  Patient returns to the clinic today with pain worsening in the past month. She informs when she sits down, she can feel the pain. She denies feeling much pain when bending over to pick something up. She also notes having some right arm numbness.   Patient's daughter translated in the room; patient primary language is Spanish.   Patient Active Problem List   Diagnosis Date Noted  . HTN (hypertension) 02/21/2015  . Language barrier 02/21/2015    Current outpatient prescriptions:  .  hydrochlorothiazide (HYDRODIURIL) 12.5 MG tablet, Take 1 tablet (12.5 mg total) by mouth daily., Disp: 90 tablet, Rfl: 3 .  meloxicam (MOBIC) 15 MG tablet, Take 0.5-1 tablets (7.5-15 mg total) by mouth daily., Disp: 30 tablet, Rfl: 6 .  cyclobenzaprine (FLEXERIL) 10 MG tablet, Take  0.5-1 tablets (5-10 mg total) by mouth at bedtime. (Patient not taking: Reported on 02/13/2016), Disp: 60 tablet, Rfl: 6    Review of Systems  Musculoskeletal: Positive for back pain. Negative for myalgias, joint swelling, arthralgias, gait problem, neck pain and neck stiffness.  Skin: Negative for rash and wound.  Neurological: Positive for numbness. Negative for dizziness, weakness, light-headedness and headaches.       Objective:   Physical Exam  Constitutional: She is oriented to person, place, and time. She appears well-developed and well-nourished. No distress.  HENT:  Head: Normocephalic and atraumatic.  Eyes: EOM are normal. Pupils are equal, round, and reactive to light.  Neck: Neck supple.  Cardiovascular: Normal rate.   Pulmonary/Chest: Effort normal. No respiratory distress.  Musculoskeletal: Normal range of motion.  Tender over the lumbar area generally and the SI areas, but has a fairly good ROM and negative straight leg raise 90 degrees  Neurological: She is alert and oriented to person, place, and time.  DTR's symmetrical, no sensory or motor loss bilaterally  Skin: Skin is warm and dry.  Psychiatric: She has a normal mood and affect. Her behavior is normal.  Nursing note and vitals reviewed.   BP 122/82 mmHg  Pulse 72  Temp(Src) 99 F (37.2 C) (Oral)  Resp 16  Ht 5' (1.524 m)  Wt 153 lb (69.4 kg)  BMI 29.88 kg/m2  SpO2 98%    Assessment & Plan:  I have completed the patient encounter in its entirety as documented by  the scribe, with editing by me where necessary. Maxyne Derocher P. Merla Riches, M.D.  Spasm of back muscles  Low back pain, unspecified back pain laterality, with sciatica presence unspecified  Meds ordered this encounter  Medications  . tiZANidine (ZANAFLEX) 4 MG capsule    Sig: Take 1 capsule (4 mg total) by mouth 3 (three) times daily as needed for muscle spasms.    Dispense:  60 capsule    Refill:  3  . diclofenac (VOLTAREN) 75 MG EC tablet     Sig: Take 1 tablet (75 mg total) by mouth 2 (two) times daily. As needed for back pain    Dispense:  60 tablet    Refill:  1   Gaze back exercises in Spanish and indicated that she will need to do these back exercises for the rest of her life in order to keep from relapsing with her back pain

## 2016-06-29 ENCOUNTER — Ambulatory Visit (INDEPENDENT_AMBULATORY_CARE_PROVIDER_SITE_OTHER): Payer: BLUE CROSS/BLUE SHIELD | Admitting: Family Medicine

## 2016-06-29 ENCOUNTER — Ambulatory Visit (INDEPENDENT_AMBULATORY_CARE_PROVIDER_SITE_OTHER): Payer: BLUE CROSS/BLUE SHIELD

## 2016-06-29 VITALS — BP 124/80 | HR 78 | Temp 98.0°F | Resp 18 | Ht 60.0 in | Wt 153.0 lb

## 2016-06-29 DIAGNOSIS — N3091 Cystitis, unspecified with hematuria: Secondary | ICD-10-CM

## 2016-06-29 DIAGNOSIS — M545 Low back pain, unspecified: Secondary | ICD-10-CM

## 2016-06-29 DIAGNOSIS — R1032 Left lower quadrant pain: Secondary | ICD-10-CM | POA: Diagnosis not present

## 2016-06-29 LAB — POCT URINALYSIS DIP (MANUAL ENTRY)
BILIRUBIN UA: NEGATIVE
BILIRUBIN UA: NEGATIVE
GLUCOSE UA: NEGATIVE
NITRITE UA: NEGATIVE
PH UA: 7
Protein Ur, POC: NEGATIVE
Spec Grav, UA: 1.02
Urobilinogen, UA: 0.2

## 2016-06-29 LAB — POC MICROSCOPIC URINALYSIS (UMFC)

## 2016-06-29 MED ORDER — NITROFURANTOIN MONOHYD MACRO 100 MG PO CAPS: 100.0000 mg | ORAL_CAPSULE | Freq: Two times a day (BID) | ORAL | 0 refills | Status: DC

## 2016-06-29 MED ORDER — MELOXICAM 7.5 MG PO TABS: 7.5000 mg | ORAL_TABLET | Freq: Every day | ORAL | 0 refills | Status: DC

## 2016-06-29 MED ORDER — CYCLOBENZAPRINE HCL 5 MG PO TABS: ORAL_TABLET | ORAL | 0 refills | Status: DC

## 2016-06-29 NOTE — Patient Instructions (Addendum)
mobic cada dia, flexeril cada noche si necesario y cuando mejor, empiece ejercisios. si dolor no mejor en una semana regrese.   Tome medicina por infeccion de [place]. Si dolor de estomago no mejor en una semana (o si dolor empeoando antes de una semana, regresa a Event organiser).    Infeccin urinaria  (Urinary Tract Infection)  La infeccin urinaria puede ocurrir en Corporate treasurer del tracto urinario. El tracto urinario es un sistema de drenaje del cuerpo por el que se eliminan los desechos y el exceso de [place]. El tracto urinario est formado por dos riones, dos urteres, la vejiga y Engineer, mining. Los riones son rganos que tienen forma de frijol. Cada rin tiene aproximadamente el tamao del puo. Estn situados debajo de las [place], uno a cada lado de la columna vertebral CAUSAS  La causa de la infeccin son los microbios, que son organismos microscpicos, que incluyen hongos, virus, y bacterias. Estos organismos son tan pequeos que slo pueden verse a travs del microscopio. Las bacterias son los microorganismos que ms comnmente causan infecciones urinarias.  SNTOMAS  Los sntomas pueden variar segn la edad y el sexo del paciente y por la ubicacin de la infeccin. Los sntomas en las mujeres jvenes incluyen la necesidad frecuente e intensa de orinar y una sensacin dolorosa de ardor en la vejiga o en la uretra durante la miccin. Las mujeres y los hombres mayores podrn sentir cansancio, temblores y debilidad y Futures trader musculares y Engineer, mining abdominal. Si tiene [place], puede significar que la infeccin est en los riones. Otros sntomas son dolor en la espalda o en los lados debajo de las [place], nuseas y vmitos.  DIAGNSTICO  Para diagnosticar una infeccin urinaria, el mdico le preguntar acerca de sus sntomas. [employer] una [place] de [place]. La muestra de orina se analiza para Engineer, manufacturing bacterias y glbulos blancos de Risk manager. Los glbulos blancos se forman en el  organismo para ayudar a Artist las infecciones.  TRATAMIENTO  Por lo general, las infecciones urinarias pueden tratarse con medicamentos. Debido a que la [employer] de las infecciones son causadas por bacterias, por lo general pueden tratarse con antibiticos. La eleccin del antibitico y la duracin del tratamiento depender de sus sntomas y el tipo de bacteria causante de la infeccin.  INSTRUCCIONES PARA EL CUIDADO EN EL HOGAR   Si le recetaron antibiticos, tmelos exactamente como su mdico le indique. Termine el medicamento aunque se sienta mejor despus de haber tomado slo algunos.  Beba gran cantidad de lquido para mantener la orina de tono claro o color amarillo plido.  Evite la cafena, el t y las [address]. Estas sustancias irritan la vejiga.  Vaciar la vejiga con frecuencia. Evite retener la orina durante largos perodos.  Vace la vejiga antes y despus de Management consultant.  Despus de mover el intestino, las mujeres deben higienizarse la regin perineal desde adelante hacia atrs. Use slo un papel tissue por vez. SOLICITE ATENCIN MDICA SI:   Siente dolor en la espalda.  Le sube la fiebre.  Los sntomas no mejoran luego de [address]. SOLICITE ATENCIN MDICA DE INMEDIATO SI:   Siente dolor intenso en la espalda o en la zona inferior del abdomen.  Comienza a sentir escalofros.  Tiene nuseas o vmitos.  Tiene una sensacin continua de quemazn o molestias al [employer]. ASEGRESE DE QUE:   Comprende estas instrucciones.  Controlar su enfermedad.  Solicitar ayuda de inmediato si no mejora o empeora.   Esta informacin no tiene  como fin reemplazar el consejo del mdico. Asegrese de hacerle al mdico cualquier pregunta que tenga.   Document Released: 08/18/2005 Document Revised: 08/02/2012 Elsevier Interactive Patient Education 2016 [employer].   Dolor de espalda en adultos (Back Pain, Adult) El dolor de espalda es muy frecuente en los  adultos.La causa del dolor de espalda es rara vez peligrosa y Chief Technology Officer a menudo mejora con el [place].Es posible que se desconozca la causa de esta afeccin. Algunas causas comunes son las siguientes:  Distensin de los msculos o ligamentos que sostienen la columna vertebral.  Chiropractor (degeneracin) de los discos vertebrales.  Artritis.  Lesiones directas en la espalda. En [employer], el dolor de espalda es recurrente. Como rara vez es peligroso, las personas pueden aprender a Psychologist, clinical afeccin por s mismas. INSTRUCCIONES PARA EL CUIDADO EN EL HOGAR Controle su dolor de espalda a fin de Public house manager cambio. Las siguientes indicaciones ayudarn a Architectural technologist que pueda sentir:  Medical illustrator. Si permanece sentado o de pie en un mismo lugar durante mucho tiempo, se tensiona la espalda. No se siente, conduzca o permanezca de pie en un mismo lugar durante ms de 30 minutos seguidos. Realice caminatas cortas en superficies planas tan pronto como le sea posible.Trate de caminar un poco ms de Pharmacist, community.  Haga ejercicio regularmente como se lo haya indicado el mdico. El ejercicio ayuda a que su espalda se cure ms rpidamente. Tambin ayuda a prevenir futuras lesiones al [employer] fuertes y flexibles.  No permanezca en la cama.Si hace reposo ms de 1 a 2 das, puede demorar su recuperacin.  Preste atencin a su cuerpo al inclinarse y levantarse. Las posiciones ms cmodas son las que ejercen menos tensin en la espalda en recuperacin. Siempre use tcnicas apropiadas para levantar objetos, como por ejemplo:  Flexionar las rodillas.  Mantener la carga cerca del cuerpo.  No torcerse.  Encuentre una posicin cmoda para dormir. Use un colchn firme y recustese de costado con las rodillas ligeramente flexionadas. Si se recuesta [employer], coloque una almohada debajo de las rodillas.  Evite sentir ansiedad o estrs.El estrs aumenta la  tensin muscular y puede empeorar el dolor de espalda.Es importante reconocer si se siente ansioso o estresado y aprender maneras de controlarlo, por ejemplo haciendo ejercicio.  Tome los medicamentos solamente como se lo haya indicado el mdico. Los medicamentos de venta libre para Engineer, materials y la inflamacin a menudo son los ms eficaces.El mdico puede recetarle relajantes musculares.Estos medicamentos ayudan a Primary school teacher de modo que pueda reanudar ms rpidamente sus actividades normales y el ejercicio saludable.  Aplique hielo sobre la zona lesionada.  Ponga el hielo en una bolsa plstica.  Coloque una toalla entre la piel y la bolsa de hielo.  Deje el hielo durante , 2 a 3veces por da, durante los primeros 2 o 3das. Despus de eso, puede alternar el hielo y el calor para reducir Chief Technology Officer y los espasmos.  Mantenga un peso saludable. El exceso de peso ejerce presin adicional sobre la espalda y hace que resulte difcil mantener una buena [place]. SOLICITE ATENCIN MDICA SI:  Siente un dolor que no se alivia con reposo o medicamentos.  Siente mucho dolor que se extiende a las piernas o los glteos.  El dolor no mejora en una semana.  Siente dolor por la noche.  Pierde peso.  Siente escalofros o fiebre. SOLICITE ATENCIN MDICA DE INMEDIATO SI:   Tiene nuevos problemas para controlar  la vejiga o los intestinos.  Siente debilidad o adormecimiento inusuales en los brazos o en las piernas.  Siente nuseas o vmitos.  Siente dolor abdominal.  Siente que va a desmayarse.   Esta informacin no tiene Theme park managercomo fin reemplazar el consejo del mdico. Asegrese de hacerle al mdico cualquier pregunta que tenga.   Document Released: 11/08/2005 Document Revised: 11/29/2014 Elsevier Interactive Patient Education [employer]2016 Elsevier Inc.   IF you received an x-ray today, you will receive an invoice from [facility]. Please contact [facility] at  [phone] with questions or concerns regarding your invoice.   IF you received labwork today, you will receive an invoice from [facility]. Please contact [facility] at [phone] with questions or concerns regarding your invoice.   Our billing staff will not be able to assist you with questions regarding bills from these companies.  You will be contacted with the lab results as soon as they are available. The fastest way to get your results is to activate your My Chart account. Instructions are located on the last page of this paperwork. If you have not heard from us regarding the results in 2 weeks, please contact this office.    We recommend that you schedule a mammogram for breast cancer screening. Typically, you do not need a referral to do this. Please contact a local imaging center to schedule your mammogram.  [facility] - [phone]) [phone]  *ask for the Radiology Department [facility]) - [phone]) [phone] or [phone]) [phone]  [facility] - [phone]) [phone] [facility] - [phone]) [phone] [facility] - [phone]) [phone]  *ask for the Radiology Department [facility] - [phone]) [phone]  *ask for the Radiology Department [facility] - [phone]  *ask for the Mammography Department [facility] - [phone]) [phone]

## 2016-06-29 NOTE — Progress Notes (Signed)
By signing my name below, I, Alyssa Walker, attest that this documentation has been prepared under the direction and in the presence of Meredith Staggers, MD.  Electronically Signed: Arvilla Market, Medical Scribe. 06/29/16. 10:14 AM.  Subjective:    Patient ID: Alyssa Walker, female    DOB: [birth year], 58 y.o.   MRN: [id number]  HPI No chief complaint on file.   HPI Comments: Alyssa Walker is a 60 y.o. female who presents to the [facility] complaining of sharp back pain that radiates to the left front hip onset a week. Pt was seen in March this year with Dr. Merla Riches- treated then with Voltaren, and Zanaflex, as well as home exercise program. She had a x-ray in Nov 2016; multilevel DDD. She was also treated in Dec 2016 for back pain with Meloxicam and Flexeril.  Pt reports back pain when she walks and stands, that feels inflamed and radiates to her groin and hip. Pt mentions the pain today is further down in her buttock, while yesterday it was more in her lower back. Pt takes Advil for relief every 4 hours when needed. Pt cleans houses occasionally and didn't do any home exercises that was recommended from her last office visit in March 2017. Pt denies the pain radiating to her leg, rash, melena, or experiencing any urinary symptoms- dysuria, difficulty urinating, hematuria, and any other urinary symptoms.  Patient Active Problem List   Diagnosis Date Noted  . HTN (hypertension) 02/21/2015  . Language barrier 02/21/2015   Past Medical History:  Diagnosis Date  . Hypertension    Past Surgical History:  Procedure Laterality Date  . ABDOMINAL HYSTERECTOMY    . CORONARY ARTERY BYPASS GRAFT     No Known Allergies Prior to Admission medications   Medication Sig Start Date End Date Taking? Authorizing Provider  diclofenac (VOLTAREN) 75 MG EC tablet Take 1 tablet (75 mg total) by mouth 2 (two) times daily. As needed for back pain 02/13/16  Yes Tonye Pearson, MD    hydrochlorothiazide (HYDRODIURIL) 12.5 MG tablet Take 1 tablet (12.5 mg total) by mouth daily. 11/02/15  Yes Wallis Bamberg, PA-C  tiZANidine (ZANAFLEX) 4 MG capsule Take 1 capsule (4 mg total) by mouth 3 (three) times daily as needed for muscle spasms. 02/13/16  Yes Tonye Pearson, MD  cyclobenzaprine (FLEXERIL) 10 MG tablet Take 0.5-1 tablets (5-10 mg total) by mouth at bedtime. Patient not taking: Reported on 02/13/2016 10/05/15   Wallis Bamberg, PA-C  meloxicam (MOBIC) 15 MG tablet Take 0.5-1 tablets (7.5-15 mg total) by mouth daily. Patient not taking: Reported on 06/29/2016 10/05/15   Wallis Bamberg, PA-C   Social History   Social History  . Marital status: Married    Spouse name: N/A  . Number of children: N/A  . Years of education: N/A   Occupational History  . Not on file.   Social History Main Topics  . Smoking status: Never Smoker  . Smokeless tobacco: Never Used  . Alcohol use No  . Drug use: No  . Sexual activity: Not on file   Other Topics Concern  . Not on file   Social History Narrative  . No narrative on file   Depression screen [facility] 2/9 02/13/2016 10/05/2015  Decreased Interest 0 0  Down, Depressed, Hopeless 0 0  PHQ - 2 Score 0 0   Review of Systems  Gastrointestinal: Positive for abdominal pain. Negative for blood in stool.  Genitourinary: Positive for pelvic pain. Negative  for dysuria, frequency, hematuria, menstrual problem, urgency, vaginal bleeding, vaginal discharge and vaginal pain.  Musculoskeletal: Positive for back pain. Negative for myalgias.  Skin: Negative for rash.    Objective:   Physical Exam  Constitutional: She appears well-developed and well-nourished. No distress.  HENT:  Head: Normocephalic and atraumatic.  Eyes: Conjunctivae are normal.  Neck: Neck supple.  Cardiovascular: Normal rate, regular rhythm and normal heart sounds.  Exam reveals no gallop and no friction rub.   No murmur heard. Pulmonary/Chest: Effort normal and breath sounds  normal. No respiratory distress. She has no wheezes. She has no rales.  Abdominal: There is tenderness (slight discomfort) in the right lower quadrant.  Musculoskeletal:  Slight discomfort lower mid lumbar spine Reproducible left paraspinal muscles Reproducible pain to the left SI joint and sciatic notch, full flexion Able to reproduce pain with left lateral flexion, otherwise ROM intact She also has pain with right rotation Negative seated straight leg raise  Neurological: She is alert. She displays no Babinski's sign on the right side. She displays no Babinski's sign on the left side.  Reflex Scores:      Patellar reflexes are 2+ on the right side and 2+ on the left side.      Achilles reflexes are 2+ on the right side and 2+ on the left side. Skin: Skin is warm and dry.  Psychiatric: She has a normal mood and affect. Her behavior is normal.  Nursing note and vitals reviewed.  BP 124/80 (BP Location: Right Arm, Patient Position: Sitting, Cuff Size: Small)   Pulse 78   Temp 98 F (36.7 C) (Oral)   Resp 18   Ht 5' (1.524 m)   Wt 153 lb (69.4 kg)   SpO2 98%   BMI 29.88 kg/m    Results for orders placed or performed in visit on 06/29/16  POCT urinalysis dipstick  Result Value Ref Range   Color, UA yellow yellow   Clarity, UA clear clear   Glucose, UA negative negative   Bilirubin, UA negative negative   Ketones, POC UA negative negative   Spec Grav, UA 1.020    Blood, UA trace-lysed (A) negative   pH, UA 7.0    Protein Ur, POC negative negative   Urobilinogen, UA 0.2    Nitrite, UA Negative Negative   Leukocytes, UA small (1+) (A) Negative  POCT Microscopic Urinalysis ([facility])  Result Value Ref Range   WBC,UR,HPF,POC Few (A) None WBC/hpf   RBC,UR,HPF,POC Few (A) None RBC/hpf   Bacteria Few (A) None, Too numerous to count   Mucus Present (A) Absent   Epithelial Cells, UR Per Microscopy Few (A) None, Too numerous to count cells/hpf   Dg Lumbar Spine 2-3 Views  Result  Date: 06/29/2016 CLINICAL DATA:  Left low back pain for 1 week radiating to the sacroiliac joint. EXAM: LUMBAR SPINE - 2-3 VIEW COMPARISON:  October 05, 2015 FINDINGS: There is no evidence of lumbar spine fracture. Alignment is normal. There mild degenerative joint changes throughout lumbar spine with mild anterior osteophytosis IMPRESSION: No acute fracture dislocation. Mild degenerative joint changes of lumbar spine. Electronically Signed   By: Sherian ReinWei-Chen  Lin M.D.   On: 06/29/2016 10:45   Assessment & Plan:    Turner BingMARIA R Andrena Walker is a 60 y.o. female Left-sided low back pain without sciatica - Plan: meloxicam (MOBIC) 7.5 MG tablet, cyclobenzaprine (FLEXERIL) 5 MG tablet, DG Lumbar Spine 2-3 Views Low back pain, unspecified back pain laterality, with sciatica presence unspecified - Plan: meloxicam (  MOBIC) 7.5 MG tablet, cyclobenzaprine (FLEXERIL) 5 MG tablet  - Current left low back pain, suspected degenerative disc disease, mechanical pain as in past. No concerning findings on exam or x-ray. Trial of meloxicam, Flexeril, return to home exercise program as previously prescribed, and RTC precautions if persistent.  LLQ abdominal pain - Plan: POCT urinalysis dipstick, POCT Microscopic Urinalysis ([facility]), Urine culture, nitrofurantoin, macrocrystal-monohydrate, (MACROBID) 100 MG capsule Hemorrhagic cystitis - Plan: Urine culture, nitrofurantoin, macrocrystal-monohydrate, (MACROBID) 100 MG capsule  -Likely hemorrhagic cystitis. Urine culture pending. Start Macrobid, RTC precautions.  Language barrier. Spanish spoken and daughter translating with English, understanding expressed with both.  Meds ordered this encounter  Medications  . meloxicam (MOBIC) 7.5 MG tablet    Sig: Take 1 tablet (7.5 mg total) by mouth daily.    Dispense:  30 tablet    Refill:  0  . cyclobenzaprine (FLEXERIL) 5 MG tablet    Sig: 1 pill by mouth up to every 8 hours as needed. Start with one pill by mouth each bedtime as needed due  to sedation    Dispense:  15 tablet    Refill:  0  . nitrofurantoin, macrocrystal-monohydrate, (MACROBID) 100 MG capsule    Sig: Take 1 capsule (100 mg total) by mouth 2 (two) times daily.    Dispense:  14 capsule    Refill:  0   Patient Instructions   mobic cada dia, flexeril cada noche si necesario y cuando mejor, empiece ejercisios. si dolor no mejor en una semana regrese.   Tome medicina por infeccion de [place]. Si dolor de estomago no mejor en una semana (o si dolor empeoando antes de una semana, regresa a Event organiser).    Infeccin urinaria  (Urinary Tract Infection)  La infeccin urinaria puede ocurrir en Corporate treasurer del tracto urinario. El tracto urinario es un sistema de drenaje del cuerpo por el que se eliminan los desechos y el exceso de [place]. El tracto urinario est formado por dos riones, dos urteres, la vejiga y Engineer, mining. Los riones son rganos que tienen forma de frijol. Cada rin tiene aproximadamente el tamao del puo. Estn situados debajo de las [place], uno a cada lado de la columna vertebral CAUSAS  La causa de la infeccin son los microbios, que son organismos microscpicos, que incluyen hongos, virus, y bacterias. Estos organismos son tan pequeos que slo pueden verse a travs del microscopio. Las bacterias son los microorganismos que ms comnmente causan infecciones urinarias.  SNTOMAS  Los sntomas pueden variar segn la edad y el sexo del paciente y por la ubicacin de la infeccin. Los sntomas en las mujeres jvenes incluyen la necesidad frecuente e intensa de orinar y una sensacin dolorosa de ardor en la vejiga o en la uretra durante la miccin. Las mujeres y los hombres mayores podrn sentir cansancio, temblores y debilidad y Futures trader musculares y Engineer, mining abdominal. Si tiene [place], puede significar que la infeccin est en los riones. Otros sntomas son dolor en la espalda o en los lados debajo de las [place], nuseas y vmitos.  DIAGNSTICO    Para diagnosticar una infeccin urinaria, el mdico le preguntar acerca de sus sntomas. [employer] una [place] de [place]. La muestra de orina se analiza para Engineer, manufacturing bacterias y glbulos blancos de Risk manager. Los glbulos blancos se forman en el organismo para ayudar a Artist las infecciones.  TRATAMIENTO  Por lo general, las infecciones urinarias pueden tratarse con medicamentos. Debido a que la [place] de las infecciones son causadas  por bacterias, por lo general pueden tratarse con antibiticos. La eleccin del antibitico y la duracin del tratamiento depender de sus sntomas y el tipo de bacteria causante de la infeccin.  INSTRUCCIONES PARA EL CUIDADO EN EL HOGAR   Si le recetaron antibiticos, tmelos exactamente como su mdico le indique. Termine el medicamento aunque se sienta mejor despus de haber tomado slo algunos.  Beba gran cantidad de lquido para mantener la orina de tono claro o color amarillo plido.  Evite la cafena, el t y las [address]. Estas sustancias irritan la vejiga.  Vaciar la vejiga con frecuencia. Evite retener la orina durante largos perodos.  Vace la vejiga antes y despus de Management consultant.  Despus de mover el intestino, las mujeres deben higienizarse la regin perineal desde adelante hacia atrs. Use slo un papel tissue por vez. SOLICITE ATENCIN MDICA SI:   Siente dolor en la espalda.  Le sube la fiebre.  Los sntomas no mejoran luego de [address]. SOLICITE ATENCIN MDICA DE INMEDIATO SI:   Siente dolor intenso en la espalda o en la zona inferior del abdomen.  Comienza a sentir escalofros.  Tiene nuseas o vmitos.  Tiene una sensacin continua de quemazn o molestias al [employer]. ASEGRESE DE QUE:   Comprende estas instrucciones.  Controlar su enfermedad.  Solicitar ayuda de inmediato si no mejora o empeora.   Esta informacin no tiene Theme park manager el consejo del mdico. Asegrese de hacerle  al mdico cualquier pregunta que tenga.   Document Released: 08/18/2005 Document Revised: 08/02/2012 Elsevier Interactive Patient Education 2016 [employer].   Dolor de espalda en adultos (Back Pain, Adult) El dolor de espalda es muy frecuente en los adultos.La causa del dolor de espalda es rara vez peligrosa y Chief Technology Officer a menudo mejora con el [place].Es posible que se desconozca la causa de esta afeccin. Algunas causas comunes son las siguientes:  Distensin de los msculos o ligamentos que sostienen la columna vertebral.  Chiropractor (degeneracin) de los discos vertebrales.  Artritis.  Lesiones directas en la espalda. En [employer], el dolor de espalda es recurrente. Como rara vez es peligroso, las personas pueden aprender a Psychologist, clinical afeccin por s mismas. INSTRUCCIONES PARA EL CUIDADO EN EL HOGAR Controle su dolor de espalda a fin de Public house manager cambio. Las siguientes indicaciones ayudarn a Architectural technologist que pueda sentir:  Medical illustrator. Si permanece sentado o de pie en un mismo lugar durante mucho tiempo, se tensiona la espalda. No se siente, conduzca o permanezca de pie en un mismo lugar durante ms de 30 minutos seguidos. Realice caminatas cortas en superficies planas tan pronto como le sea posible.Trate de caminar un poco ms de Pharmacist, community.  Haga ejercicio regularmente como se lo haya indicado el mdico. El ejercicio ayuda a que su espalda se cure ms rpidamente. Tambin ayuda a prevenir futuras lesiones al [employer] fuertes y flexibles.  No permanezca en la cama.Si hace reposo ms de 1 a 2 das, puede demorar su recuperacin.  Preste atencin a su cuerpo al inclinarse y levantarse. Las posiciones ms cmodas son las que ejercen menos tensin en la espalda en recuperacin. Siempre use tcnicas apropiadas para levantar objetos, como por ejemplo:  Flexionar las rodillas.  Mantener la carga cerca del cuerpo.  No  torcerse.  Encuentre una posicin cmoda para dormir. Use un colchn firme y recustese de costado con las rodillas ligeramente flexionadas. Si se recuesta [employer], coloque una almohada debajo de las  rodillas.  Evite sentir ansiedad o estrs.El estrs aumenta la tensin muscular y puede empeorar el dolor de espalda.Es importante reconocer si se siente ansioso o estresado y aprender maneras de controlarlo, por ejemplo haciendo ejercicio.  Tome los medicamentos solamente como se lo haya indicado el mdico. Los medicamentos de venta libre para Engineer, materials y la inflamacin a menudo son los ms eficaces.El mdico puede recetarle relajantes musculares.Estos medicamentos ayudan a Primary school teacher de modo que pueda reanudar ms rpidamente sus actividades normales y el ejercicio saludable.  Aplique hielo sobre la zona lesionada.  Ponga el hielo en una bolsa plstica.  Coloque una toalla entre la piel y la bolsa de hielo.  Deje el hielo durante , 2 a 3veces por da, durante los primeros 2 o 3das. Despus de eso, puede alternar el hielo y el calor para reducir Chief Technology Officer y los espasmos.  Mantenga un peso saludable. El exceso de peso ejerce presin adicional sobre la espalda y hace que resulte difcil mantener una buena [place]. SOLICITE ATENCIN MDICA SI:  Siente un dolor que no se alivia con reposo o medicamentos.  Siente mucho dolor que se extiende a las piernas o los glteos.  El dolor no mejora en una semana.  Siente dolor por la noche.  Pierde peso.  Siente escalofros o fiebre. SOLICITE ATENCIN MDICA DE INMEDIATO SI:   Tiene nuevos problemas para controlar la vejiga o los intestinos.  Siente debilidad o adormecimiento inusuales en los brazos o en las piernas.  Siente nuseas o vmitos.  Siente dolor abdominal.  Siente que va a desmayarse.   Esta informacin no tiene Theme park manager el consejo del mdico. Asegrese de hacerle al mdico cualquier  pregunta que tenga.   Document Released: 11/08/2005 Document Revised: 11/29/2014 Elsevier Interactive Patient Education [employer]   IF you received an x-ray today, you will receive an invoice from [facility]. Please contact [facility] at [phone] with questions or concerns regarding your invoice.   IF you received labwork today, you will receive an invoice from [employer]. Please contact [facility] at [phone] with questions or concerns regarding your invoice.   Our billing staff will not be able to assist you with questions regarding bills from these companies.  You will be contacted with the lab results as soon as they are available. The fastest way to get your results is to activate your My Chart account. Instructions are located on the last page of this paperwork. If you have not heard from [place] regarding the results in 2 weeks, please contact this office.    We recommend that you schedule a mammogram for breast cancer screening. Typically, you do not need a referral to do this. Please contact a local imaging center to schedule your mammogram.  [facility] - [phone]  *ask for the Radiology Department [facility]) - [phone] or [phone]  [facility] - [phone] [facility] - [phone] [facility] - [phone]  *ask for the Radiology Department [facility] - [phone]  *ask for the Radiology Department [facility] - [phone]  *ask for the Mammography Department [facility] - [phone]  I personally performed the services described in this documentation, which was scribed in my presence. The recorded information has been reviewed and considered, and addended by me as needed.   Signed,   Meredith Staggers, MD [facility].  06/29/16 11:18  AM

## 2016-06-30 LAB — URINE CULTURE

## 2016-07-02 ENCOUNTER — Encounter: Payer: Self-pay | Admitting: *Deleted

## 2016-10-13 ENCOUNTER — Ambulatory Visit (INDEPENDENT_AMBULATORY_CARE_PROVIDER_SITE_OTHER): Payer: BLUE CROSS/BLUE SHIELD | Admitting: Physician Assistant

## 2016-10-13 VITALS — BP 124/82 | HR 75 | Temp 98.0°F | Resp 17 | Ht 60.0 in | Wt 153.0 lb

## 2016-10-13 DIAGNOSIS — M5136 Other intervertebral disc degeneration, lumbar region: Secondary | ICD-10-CM | POA: Diagnosis not present

## 2016-10-13 DIAGNOSIS — G44209 Tension-type headache, unspecified, not intractable: Secondary | ICD-10-CM

## 2016-10-13 DIAGNOSIS — M51369 Other intervertebral disc degeneration, lumbar region without mention of lumbar back pain or lower extremity pain: Secondary | ICD-10-CM

## 2016-10-13 DIAGNOSIS — J0110 Acute frontal sinusitis, unspecified: Secondary | ICD-10-CM

## 2016-10-13 MED ORDER — CYCLOBENZAPRINE HCL 5 MG PO TABS: ORAL_TABLET | ORAL | 0 refills | Status: DC

## 2016-10-13 MED ORDER — AMOXICILLIN-POT CLAVULANATE 875-125 MG PO TABS: 1.0000 | ORAL_TABLET | Freq: Two times a day (BID) | ORAL | 0 refills | Status: AC

## 2016-10-13 MED ORDER — MELOXICAM 7.5 MG PO TABS: 7.5000 mg | ORAL_TABLET | Freq: Every day | ORAL | 0 refills | Status: DC

## 2016-10-13 NOTE — Patient Instructions (Addendum)
When you are well, please return for an annual wellness visit. The pap smear can be done at that time, as well as recommended screening tests and vaccines for your age. Please plan to fast (nothing to eat, and only water, black coffee or unsweetened tea with your medications) 8-12 hours before your visit.    IF you received an x-ray today, you will receive an invoice from [facility]. Please contact [facility] at [phone] with questions or concerns regarding your invoice.   IF you received labwork today, you will receive an invoice from [facility]. Please contact [facility] at [phone] with questions or concerns regarding your invoice.   Our billing staff will not be able to assist you with questions regarding bills from these companies.  You will be contacted with the lab results as soon as they are available. The fastest way to get your results is to activate your My Chart account. Instructions are located on the last page of this paperwork. If you have not heard from us regarding the results in 2 weeks, please contact this office.     We recommend that you schedule a mammogram for breast cancer screening. Typically, you do not need a referral to do this. Please contact a local imaging center to schedule your mammogram.  [facility] - [phone]  *ask for the Radiology Department [facility]) - [phone](336) [phone] or [phone]  [facility] - [phone] [facility] - [phone]) [phone] [facility] - [phone]  *ask for the Radiology Department [facility] - [phone]) [phone]  *ask for the Radiology Department [facility] - [phone]  *ask for the Mammography Department [facility] - [phone]

## 2016-10-13 NOTE — Progress Notes (Signed)
Patient ID: Alyssa CannyMARIA R Stalvey, female    DOB: 10/02/1956, 60 y.o.   MRN: [id number]  PCP: No PCP Per Patient  Chief Complaint  Patient presents with  . Headache    x1week. Fever and chills.   . Medication Refill    flexaril and macrobid  . Pap smear    INFORMED PT VIA TRANSLATOR OF ONE COMPLAINT POLICY    Subjective:   Presents for evaluation of headache, medication refills and pap smear. She is accompanied by her daughter, who translates as needed.  Headache has been present x 1 week. She describes it as constant, 8/10 pain, but states that it resolves temporarily with acetaminophen. Some dizziness ("like my head is flying") and vision feels cloudy. No blurred vision. No double vision. No nausea, vomiting or diarrhea. No fever/chills. No syncope/near syncope.  The pain encompasses the entire head and neck. The pain is worse on the RIGHT side of the head and neck No photophobia or phonophobia. No weakness. No paresthesias. Nothing makes it worse. No headache like this previously.  She requests a refill of cyclobenzaprine and nitrofurantoin. She received these at previous visits for back pain, which was associated with a UTI. She denies urinary urgency, frequency, burning, hematuria. No urinary leakage. No low back pain. No abdominal pain.  She has a number of care gaps, including cervical cancer screening, breast cancer screening, vaccinations (influenza, Tdap) and Hep C, HIV screening and colonoscopy.    Review of Systems  Constitutional: Negative.   HENT: Positive for congestion, sinus pain and sinus pressure. Negative for dental problem, drooling, ear discharge, ear pain, facial swelling, hearing loss, mouth sores, nosebleeds, postnasal drip, rhinorrhea, sneezing, sore throat, tinnitus, trouble swallowing and voice change.   Eyes: Positive for visual disturbance (cloudy). Negative for photophobia, pain, discharge, redness and itching.  Respiratory: Negative.   Cardiovascular:  Negative.   Gastrointestinal: Negative.   Endocrine: Negative.   Genitourinary: Negative.   Musculoskeletal: Positive for neck pain. Negative for arthralgias, back pain, gait problem, joint swelling, myalgias and neck stiffness.  Allergic/Immunologic: Negative.   Neurological: Positive for dizziness, light-headedness and headaches. Negative for tremors, seizures, syncope, facial asymmetry, speech difficulty, weakness and numbness.  Hematological: Negative.   Psychiatric/Behavioral: Negative.    As above.    Patient Active Problem List   Diagnosis Date Noted  . HTN (hypertension) 02/21/2015  . Language barrier 02/21/2015     Prior to Admission medications   Medication Sig Start Date End Date Taking? Authorizing Provider  hydrochlorothiazide (HYDRODIURIL) 12.5 MG tablet Take 1 tablet (12.5 mg total) by mouth daily. 11/02/15  Yes Wallis BambergMario Mani, PA-C  meloxicam (MOBIC) 7.5 MG tablet Take 1 tablet (7.5 mg total) by mouth daily. 06/29/16  Yes Shade FloodJeffrey R Greene, MD  cyclobenzaprine (FLEXERIL) 5 MG tablet 1 pill by mouth up to every 8 hours as needed. Start with one pill by mouth each bedtime as needed due to sedation Patient not taking: Reported on 10/13/2016 06/29/16   Shade FloodJeffrey R Greene, MD  nitrofurantoin, macrocrystal-monohydrate, (MACROBID) 100 MG capsule Take 1 capsule (100 mg total) by mouth 2 (two) times daily. Patient not taking: Reported on 10/13/2016 06/29/16   Shade FloodJeffrey R Greene, MD     No Known Allergies     Objective:  Physical Exam  Constitutional: She is oriented to person, place, and time. She appears well-developed and well-nourished. She is active and cooperative. No distress.  BP 124/82 (BP Location: Right Arm, Patient Position: Sitting, Cuff Size: Large)   Pulse  75   Temp 98 F (36.7 C) (Oral)   Resp 17   Ht 5' (1.524 m)   Wt 153 lb (69.4 kg)   SpO2 97%   BMI 29.88 kg/m   HENT:  Head: Normocephalic and atraumatic.  Right Ear: Hearing, tympanic membrane, external ear  and ear canal normal.  Left Ear: Hearing, tympanic membrane, external ear and ear canal normal.  Nose: Right sinus exhibits frontal sinus tenderness. Right sinus exhibits no maxillary sinus tenderness. Left sinus exhibits frontal sinus tenderness. Left sinus exhibits no maxillary sinus tenderness.  Mouth/Throat: Uvula is midline, oropharynx is clear and moist and mucous membranes are normal. No uvula swelling.  Eyes: Conjunctivae, EOM and lids are normal. Pupils are equal, round, and reactive to light. No scleral icterus.  Neck: Normal range of motion, full passive range of motion without pain and phonation normal. Neck supple. Muscular tenderness (R>L) present. No spinous process tenderness present. No thyromegaly present.  Cardiovascular: Normal rate, regular rhythm and normal heart sounds.   Pulses:      Radial pulses are 2+ on the right side, and 2+ on the left side.  Pulmonary/Chest: Effort normal and breath sounds normal.  Musculoskeletal:       Cervical back: She exhibits tenderness and pain. She exhibits normal range of motion, no bony tenderness, no swelling, no edema, no deformity, no laceration, no spasm and normal pulse.  Lymphadenopathy:       Head (right side): No tonsillar, no preauricular, no posterior auricular and no occipital adenopathy present.       Head (left side): No tonsillar, no preauricular, no posterior auricular and no occipital adenopathy present.    She has no cervical adenopathy.       Right: No supraclavicular adenopathy present.       Left: No supraclavicular adenopathy present.  Neurological: She is alert and oriented to person, place, and time. She has normal strength. No cranial nerve deficit or sensory deficit.  Reflex Scores:      Bicep reflexes are 2+ on the right side and 2+ on the left side.      Patellar reflexes are 2+ on the right side and 2+ on the left side.      Achilles reflexes are 2+ on the right side and 2+ on the left side. Skin: Skin is  warm, dry and intact. No rash noted. No cyanosis or erythema. Nails show no clubbing.  Psychiatric: She has a normal mood and affect. Her speech is normal and behavior is normal.           Assessment & Plan:   1. Acute non intractable tension-type headache Likely due to sinusitis, though there is a good chance that she also has DDD in the C-spine, which may contribute. Unlikely to be migraine.   2. Acute non-recurrent frontal sinusitis Supportive care. Anticipatory guidance. - amoxicillin-clavulanate (AUGMENTIN) 875-125 MG tablet; Take 1 tablet by mouth 2 (two) times daily.  Dispense: 20 tablet; Refill: 0  3. DDD (degenerative disc disease), lumbar Treatment will likely also help her headache.  - meloxicam (MOBIC) 7.5 MG tablet; Take 1 tablet (7.5 mg total) by mouth daily.  Dispense: 30 tablet; Refill: 0 - cyclobenzaprine (FLEXERIL) 5 MG tablet; 1 pill by mouth up to every 8 hours as needed. Start with one pill by mouth each bedtime as needed due to sedation  Dispense: 15 tablet; Refill: 0   Return when she is well for wellness visit/health maintenance.  Fernande Brashelle S. Muaad Boehning, PA-C Physician  [facility]

## 2016-11-07 ENCOUNTER — Other Ambulatory Visit: Payer: Self-pay | Admitting: Physician Assistant

## 2016-11-07 DIAGNOSIS — M5136 Other intervertebral disc degeneration, lumbar region: Secondary | ICD-10-CM

## 2016-11-10 NOTE — Telephone Encounter (Signed)
09/2016 last ov and refill 

## 2016-11-11 NOTE — Telephone Encounter (Signed)
Meds ordered this encounter  Medications  . cyclobenzaprine (FLEXERIL) 5 MG tablet    Sig: TAKE ONE TABLET BY MOUTH UP TO EVERY 8 HOURS AS NEEDED. START WITH ONE PILL BY MOUTH AT BEDTIME AS NEEDED DUE TO SEDATION.    Dispense:  15 tablet    Refill:  0    Please consider 90 day supplies to promote better adherence

## 2016-12-14 ENCOUNTER — Ambulatory Visit (INDEPENDENT_AMBULATORY_CARE_PROVIDER_SITE_OTHER): Payer: BLUE CROSS/BLUE SHIELD | Admitting: Physician Assistant

## 2016-12-14 VITALS — BP 122/72 | HR 75 | Temp 98.3°F | Resp 17 | Ht 60.5 in | Wt 153.0 lb

## 2016-12-14 DIAGNOSIS — Z131 Encounter for screening for diabetes mellitus: Secondary | ICD-10-CM | POA: Diagnosis not present

## 2016-12-14 DIAGNOSIS — Z130 Encounter for screening for diseases of the blood and blood-forming organs and certain disorders involving the immune mechanism: Secondary | ICD-10-CM

## 2016-12-14 DIAGNOSIS — Z23 Encounter for immunization: Secondary | ICD-10-CM | POA: Diagnosis not present

## 2016-12-14 DIAGNOSIS — Z114 Encounter for screening for human immunodeficiency virus [HIV]: Secondary | ICD-10-CM | POA: Diagnosis not present

## 2016-12-14 DIAGNOSIS — Z1211 Encounter for screening for malignant neoplasm of colon: Secondary | ICD-10-CM | POA: Diagnosis not present

## 2016-12-14 DIAGNOSIS — Z13220 Encounter for screening for lipoid disorders: Secondary | ICD-10-CM | POA: Diagnosis not present

## 2016-12-14 DIAGNOSIS — I10 Essential (primary) hypertension: Secondary | ICD-10-CM

## 2016-12-14 DIAGNOSIS — Z0000 Encounter for general adult medical examination without abnormal findings: Secondary | ICD-10-CM | POA: Diagnosis not present

## 2016-12-14 DIAGNOSIS — Z124 Encounter for screening for malignant neoplasm of cervix: Secondary | ICD-10-CM

## 2016-12-14 MED ORDER — HYDROCHLOROTHIAZIDE 12.5 MG PO TABS: 12.5000 mg | ORAL_TABLET | Freq: Every day | ORAL | 3 refills | Status: DC

## 2016-12-14 NOTE — Progress Notes (Signed)
[facility] [address] [place] [address] [phone]  Date:  12/14/2016   Name:  Alyssa Walker   DOB:  August 07, 1956   MRN:  [id number]  PCP:  No PCP Per Patient    Chief Complaint: Medication Refill (HCTZ) and Depression   History of Present Illness:  This is a pleasant 61 y.o. female with PMH HTN who is presenting for CPE. She is Spanish speaking, her daughter is here today interpreting. She is feeling well. Cleans houses for a living. She receives medical care in [place] every few years.   Has not had flu shot this year. Would like to have this today.   History HTN - HCTZ 12.5mg  qd. She has been taking this medication for 1 year.  She is out of her medications x4 days. Today's blood pressure is 122/72.  Surgical history - Ovarian cyst removal x 20 years ago.   Complaints: R eye twitches daily. Could be stress related.  LMP: when she was 61 years old.    Last pap: 2 years ago. In [place], negative for cervical dysplasia.  Sexual history: Yes. She is married.   Immunizations: Not UTD tetanus  Dentist: Has not been for a few years. Brushes teeth twice daily. Flosses regularly  Eye: Last exam 2 years ago in [place].  Diet/Exercise: "[place] food" Frijoles, rice, beans. Not a lot of fruit and vegetables. Does not exercise Fam hx: All healthy  Tobacco/alcohol/substance use: None   Mammogram: 2 years ago in [place]. Negative for malignancy. She performs self-breast exams every time she showers, which is every day.  Colonoscopy: Never. She is unsure whether or not she wants this today.    Review of Systems:  Review of Systems  Constitutional: Negative for chills, diaphoresis, fatigue and fever.  HENT: Negative for congestion, postnasal drip, rhinorrhea, sinus pressure, sneezing and sore throat.   Respiratory: Negative for cough, chest tightness, shortness of breath and wheezing.   Cardiovascular: Negative for chest pain and palpitations.   Gastrointestinal: Negative for abdominal pain, diarrhea, nausea and vomiting.  Genitourinary: Negative for decreased urine volume, difficulty urinating, dyspareunia, dysuria, enuresis, flank pain, frequency, hematuria, urgency, vaginal bleeding and vaginal pain.  Neurological: Negative for dizziness, weakness, light-headedness and headaches.  Psychiatric/Behavioral: Negative for dysphoric mood, sleep disturbance and suicidal ideas. The patient is not nervous/anxious.     Patient Active Problem List   Diagnosis Date Noted  . DDD (degenerative disc disease), lumbar 10/13/2016  . HTN (hypertension) 02/21/2015  . Language barrier 02/21/2015    Prior to Admission medications   Medication Sig Start Date End Date Taking? Authorizing Provider  cyclobenzaprine (FLEXERIL) 5 MG tablet TAKE ONE TABLET BY MOUTH UP TO EVERY 8 HOURS AS NEEDED. START WITH ONE PILL BY MOUTH AT BEDTIME AS NEEDED DUE TO SEDATION. Patient not taking: Reported on 12/14/2016 11/11/16   Porfirio Oar, PA-C  hydrochlorothiazide (HYDRODIURIL) 12.5 MG tablet Take 1 tablet (12.5 mg total) by mouth daily. Patient not taking: Reported on 12/14/2016 11/02/15   Wallis Bamberg, PA-C  meloxicam (MOBIC) 7.5 MG tablet Take 1 tablet (7.5 mg total) by mouth daily. Patient not taking: Reported on 12/14/2016 10/13/16   Porfirio Oar, PA-C    No Known Allergies  Past Surgical History:  Procedure Laterality Date  . ABDOMINAL HYSTERECTOMY    . CORONARY ARTERY BYPASS GRAFT      Social History  Substance Use Topics  . Smoking status: Never Smoker  . Smokeless tobacco: Never Used  .  Alcohol use No    No family history on file.  Medication list has been reviewed and updated.  Physical Examination:  Physical Exam  Constitutional: She is oriented to person, place, and time. She appears well-developed and well-nourished. No distress.  HENT:  Head: Normocephalic and atraumatic.  Mouth/Throat: Oropharynx is clear and moist.  Eyes:  Conjunctivae and EOM are normal. Pupils are equal, round, and reactive to light.  Cardiovascular: Normal rate, regular rhythm and normal heart sounds.   No murmur heard. Pulmonary/Chest: Effort normal and breath sounds normal. She has no wheezes.  Abdominal: Soft. There is no tenderness.  Genitourinary: Vagina normal. Uterus is not fixed and not tender. Right adnexum displays no tenderness. Left adnexum displays no tenderness.  Musculoskeletal: Normal range of motion.  Neurological: She is alert and oriented to person, place, and time. She has normal reflexes.  Skin: Skin is warm and dry.  Psychiatric: She has a normal mood and affect. Her behavior is normal. Judgment and thought content normal.  Vitals reviewed.   BP 122/72 (BP Location: Right Arm, Patient Position: Sitting, Cuff Size: Normal)   Pulse 75   Temp 98.3 F (36.8 C) (Oral)   Resp 17   Ht 5' 0.5" (1.537 m)   Wt 153 lb (69.4 kg)   SpO2 97%   BMI 29.39 kg/m   Assessment and Plan: 1. Annual physical exam 2. Essential hypertension - hydrochlorothiazide (HYDRODIURIL) 12.5 MG tablet; Take 1 tablet (12.5 mg total) by mouth daily.  Dispense: 90 tablet; Refill: 723 - 61 year old female for annual exam. Feeling well. Blood pressure controlled, despite her not taking medications x 4 days. Labs pending.  3. Screen for colon cancer - Ambulatory referral to Gastroenterology  4. Cervical cancer screening - Pap IG and HPV (high risk) DNA detection  5. Need for prophylactic vaccination and inoculation against influenza - Flu Vaccine QUAD 36+ mos IM  6. Screening for HIV (human immunodeficiency virus) - HIV antibody  7. Screening, lipid - Lipid panel  8. Screening for diabetes mellitus - Basic metabolic panel  9. Screening, anemia, deficiency, iron - CBC with Differential/Platelet   Marco CollieWhitney Aiman Noe, PA-C  [facility] 12/14/2016 10:45 AM

## 2016-12-14 NOTE — Patient Instructions (Addendum)
Please make appointment for a routine cleaning of your teeth.   Thank you for coming in today. I hope you feel we met your needs.  Feel free to call [facility] if you have any questions or further requests.  Please consider signing up for MyChart if you do not already have it, as this is a great way to communicate with me.  Best,  Whitney McVey, PA-C  Plan de alimentacin DASH (DASH Eating Plan) DASH es la sigla en ingls de "Enfoques Alimentarios para Detener la Hipertensin". El plan de alimentacin DASH ha demostrado bajar la presin arterial elevada (hipertensin). Los beneficios adicionales para la salud pueden incluir la disminucin del riesgo de diabetes mellitus tipo2, enfermedades cardacas e ictus. Este plan tambin puede ayudar a Horticulturist, commercial. QU DEBO SABER ACERCA DEL PLAN DE ALIMENTACIN DASH? Para el plan de alimentacin DASH, seguir las siguientes pautas generales:  Elija los alimentos que contienen menos de 150 miligramos de sodio por porcin (segn se indica en la etiqueta de los alimentos).  Use hierbas o aderezos sin sal, en lugar de sal de mesa o sal marina.  Consulte al mdico o farmacutico antes de usar sustitutos de la sal.  Consuma los productos con menor contenido de sodio. Estos productos suelen estar etiquetados como "bajo en sodio" o "sin agregado de sal".  Coma alimentos frescos. No consuma una gran cantidad de alimentos enlatados.  Coma ms verduras, frutas y productos lcteos con bajo contenido de [place].  Elija los cereales integrales. Busque la palabra "integral" en Equities trader de la lista de ingredientes.  Elija el pescado y el pollo o el pavo sin piel ms a menudo que las carnes rojas. Limite el consumo de pescado, carne de ave y carne a 6onzas (170g) por Training and development officer.  Limite el consumo de dulces, postres, azcares y bebidas azucaradas.  Elija las grasas saludables para el corazn.  Consuma ms comida casera y menos de restaurante, de buf y comida  rpida.  Limite el consumo de alimentos fritos.  No fra los alimentos. A la hora de cocinarlos, opte por hornearlos, hervirlos, grillarlos y asarlos a Administrator, arts.  Cuando coma en un restaurante, pida que preparen su comida con menos sal o, en lo posible, sin nada de sal. QU ALIMENTOS PUEDO COMER? Pida ayuda a un nutricionista para conocer las necesidades calricas individuales. Cereales  Pan de salvado o integral. Arroz integral. Pastas de salvado o integrales. Quinua, trigo burgol y cereales integrales. Cereales con bajo contenido de sodio. Tortillas de harina de maz o de salvado. Pan de maz integral. Galletas saladas integrales. Galletas con bajo contenido de [place]. Vegetales  Verduras frescas o congeladas (crudas, al vapor, asadas o grilladas). Jugos de tomate y verduras con contenido bajo o reducido de sodio. Pasta y salsa de tomate con contenido bajo o [place]. Verduras enlatadas con bajo contenido de sodio o reducido de sodio. Lambert Mody  Lambert Mody frescas, en conserva (en su jugo natural) o frutas congeladas. Carnes y otros productos con protenas  Carne de res molida (al 85% o ms [place]), carne de res de animales alimentados con pastos o carne de res sin la grasa. Pollo o pavo sin piel. Carne de pollo o de [place]. Cerdo sin la grasa. Todos los pescados y frutos de mar. Huevos. Porotos, guisantes o lentejas secos. Frutos secos y semillas sin sal. Frijoles enlatados sin sal. Lcteos  Productos lcteos con bajo contenido de grasas, como leche descremada o al 1%, quesos reducidos en grasas o al 2%,  ricota con bajo contenido de grasas o [employer], o yogur natural con bajo contenido de [place]. Quesos con contenido bajo o reducido de sodio. Grasas y Advertising copywriter en barra que no contengan grasas trans. [place] y alios para ensaladas livianos o reducidos en grasas (reducidos en sodio). [place]. Aceites de crtamo, oliva o canola. [place] natural de man o  almendra. Otros  Palomitas de maz y pretzels sin sal. Los artculos mencionados arriba pueden no ser [employer] de las bebidas o los alimentos recomendados. Comunquese con el nutricionista para conocer ms opciones.  QU ALIMENTOS NO SE RECOMIENDAN? Cereales  Pan blanco. Pastas blancas. Arroz blanco. Pan de maz refinado. Bagels y croissants. Galletas saladas que contengan grasas trans. Vegetales  Vegetales con crema o fritos. Verduras en [place]. Verduras enlatadas comunes. Pasta y salsa de tomate en lata comunes. Jugos comunes de tomate y de verduras. Lambert Mody  Fruta enlatada en almbar liviano o espeso. Jugo de frutas. Carnes y otros productos con protenas  Cortes de carne con Lobbyist. Costillas, alas de pollo, tocineta, salchicha, mortadela, salame, chinchulines, tocino, perros calientes, salchichas alemanas y embutidos envasados. Frutos secos y semillas con sal. Frijoles con sal en lata. Lcteos  Leche entera o al 2%, crema, mezcla de [place] y crema, y queso crema. Yogur entero o endulzado. Quesos o queso azul con alto contenido de Physicist, medical. [place]remas no lcteas y coberturas batidas. Quesos procesados, quesos para untar o cuajadas. Condimentos  Sal de cebolla y ajo, sal condimentada, sal de mesa y sal marina. Salsas en lata y envasadas. Salsa [place]. Salsa trtara. Salsa barbacoa. Salsa teriyaki. Salsa de soja, incluso la que tiene contenido reducido de [place]. Salsa de carne. Salsa de pescado. Salsa de [place]. Salsa rosada. Rbano picante. Ketchup y mostaza. Saborizantes y tiernizantes para carne. Caldo en cubitos. Salsa picante. Salsa tabasco. Adobos. Aderezos para tacos. Salsas. Grasas y aceites  [place], [place] en barra, [place] de [place], [place], [place] clarificada y Wendee Copp de tocino. Aceites de coco, de palmiste o de palma. Aderezos comunes para ensalada. Otros  Pickles y [place]. Palomitas de maz y pretzels con sal. Los artculos mencionados arriba pueden  no ser [employer] de las bebidas y los alimentos que se Higher education careers adviser. Comunquese con el nutricionista para obtener ms informacin.  DNDE Dolan Amen MS INFORMACIN? [place], del Pulmn y de Herbalist (National Heart, Lung, and [place]): travelstabloid.com Esta informacin no tiene Marine scientist el consejo del mdico. Asegrese de hacerle al mdico cualquier pregunta que tenga. Document Released: 10/28/2011 Document Revised: 03/01/2016 Document Reviewed: 09/12/2013 Elsevier Interactive Patient Education  2017 [place] Maintenance, Female Introduction Adopting a healthy lifestyle and getting preventive care can go a long way to promote health and wellness. Talk with your health care provider about what schedule of regular examinations is right for you. This is a good chance for you to check in with your provider about disease prevention and staying healthy. In between checkups, there are plenty of things you can do on your own. Experts have done a lot of research about which lifestyle changes and preventive measures are most likely to keep you healthy. Ask your health care provider for more information. Weight and diet Eat a healthy diet  Be sure to include plenty of vegetables, fruits, low-fat dairy products, and lean protein.  Do not eat a lot of foods high in solid fats, added sugars, or salt.  Get regular exercise. This is one of the  most important things you can do for your health.  Most adults should exercise for at least 150 minutes each week. The exercise should increase your heart rate and make you sweat (moderate-intensity exercise).  Most adults should also do strengthening exercises at least twice a week. This is in addition to the moderate-intensity exercise. Maintain a healthy weight  Body mass index (BMI) is a measurement that can be used to identify possible weight problems. It  estimates body fat based on height and weight. Your health care provider can help determine your BMI and help you achieve or maintain a healthy weight.  For females 21 years of age and older:  A BMI below 18.5 is considered underweight.  A BMI of 18.5 to 24.9 is normal.  A BMI of 25 to 29.9 is considered overweight.  A BMI of 30 and above is considered obese. Watch levels of cholesterol and blood lipids  You should start having your blood tested for lipids and cholesterol at 59 years of age, then have this test every 5 years.  You may need to have your cholesterol levels checked more often if:  Your lipid or cholesterol levels are high.  You are older than 59 years of age.  You are at high risk for heart disease. Cancer screening Lung Cancer  Lung cancer screening is recommended for adults 27-41 years old who are at high risk for lung cancer because of a history of smoking.  A yearly low-dose CT scan of the lungs is recommended for people who:  Currently smoke.  Have quit within the past 15 years.  Have at least a 30-pack-year history of smoking. A pack year is smoking an average of one pack of cigarettes a day for 1 year.  Yearly screening should continue until it has been 15 years since you quit.  Yearly screening should stop if you develop a health problem that would prevent you from having lung cancer treatment. Breast Cancer  Practice breast self-awareness. This means understanding how your breasts normally appear and feel.  It also means doing regular breast self-exams. Let your health care provider know about any changes, no matter how small.  If you are in your 20s or 30s, you should have a clinical breast exam (CBE) by a health care provider every 1-3 years as part of a regular health exam.  If you are 29 or older, have a CBE every year. Also consider having a breast X-ray (mammogram) every year.  If you have a family history of breast cancer, talk to your  health care provider about genetic screening.  If you are at high risk for breast cancer, talk to your health care provider about having an MRI and a mammogram every year.  Breast cancer gene (BRCA) assessment is recommended for women who have family members with BRCA-related cancers. BRCA-related cancers include:  Breast.  Ovarian.  Tubal.  Peritoneal cancers.  Results of the assessment will determine the need for genetic counseling and BRCA1 and BRCA2 testing. Cervical Cancer  Your health care provider may recommend that you be screened regularly for cancer of the pelvic organs (ovaries, uterus, and vagina). This screening involves a pelvic examination, including checking for microscopic changes to the surface of your cervix (Pap test). You may be encouraged to have this screening done every 3 years, beginning at age 64.  For women ages 68-65, health care providers may recommend pelvic exams and Pap testing every 3 years, or they may recommend the Pap and pelvic  exam, combined with testing for human papilloma virus (HPV), every 5 years. Some types of HPV increase your risk of cervical cancer. Testing for HPV may also be done on women of any age with unclear Pap test results.  Other health care providers may not recommend any screening for nonpregnant women who are considered low risk for pelvic cancer and who do not have symptoms. Ask your health care provider if a screening pelvic exam is right for you.  If you have had past treatment for cervical cancer or a condition that could lead to cancer, you need Pap tests and screening for cancer for at least 20 years after your treatment. If Pap tests have been discontinued, your risk factors (such as having a new sexual partner) need to be reassessed to determine if screening should resume. Some women have medical problems that increase the chance of getting cervical cancer. In these cases, your health care provider may recommend more frequent  screening and Pap tests. Colorectal Cancer  This type of cancer can be detected and often prevented.  Routine colorectal cancer screening usually begins at 59 years of age and continues through 59 years of age.  Your health care provider may recommend screening at an earlier age if you have risk factors for colon cancer.  Your health care provider may also recommend using home test kits to check for hidden blood in the stool.  A small camera at the end of a tube can be used to examine your colon directly (sigmoidoscopy or colonoscopy). This is done to check for the earliest forms of colorectal cancer.  Routine screening usually begins at age 60.  Direct examination of the colon should be repeated every 5-10 years through 59 years of age. However, you may need to be screened more often if early forms of precancerous polyps or small growths are found. Skin Cancer  Check your skin from head to toe regularly.  Tell your health care provider about any new moles or changes in moles, especially if there is a change in a mole's shape or color.  Also tell your health care provider if you have a mole that is larger than the size of a pencil eraser.  Always use sunscreen. Apply sunscreen liberally and repeatedly throughout the day.  Protect yourself by wearing long sleeves, pants, a wide-brimmed hat, and sunglasses whenever you are outside. Heart disease, diabetes, and high blood pressure  High blood pressure causes heart disease and increases the risk of stroke. High blood pressure is more likely to develop in:  People who have blood pressure in the high end of the normal range (130-139/85-89 mm Hg).  People who are overweight or obese.  People who are African American.  If you are 68-38 years of age, have your blood pressure checked every 3-5 years. If you are 34 years of age or older, have your blood pressure checked every year. You should have your blood pressure measured twice-once  when you are at a hospital or clinic, and once when you are not at a hospital or clinic. Record the average of the two measurements. To check your blood pressure when you are not at a hospital or clinic, you can use:  An automated blood pressure machine at a pharmacy.  A home blood pressure monitor.  If you are between 29 years and 34 years old, ask your health care provider if you should take aspirin to prevent strokes.  Have regular diabetes screenings. This involves taking a blood sample  to check your fasting blood sugar level.  If you are at a normal weight and have a low risk for diabetes, have this test once every three years after 59 years of age.  If you are overweight and have a high risk for diabetes, consider being tested at a younger age or more often. Preventing infection Hepatitis B  If you have a higher risk for hepatitis B, you should be screened for this virus. You are considered at high risk for hepatitis B if:  You were born in a country where hepatitis B is common. Ask your health care provider which countries are considered high risk.  Your parents were born in a high-risk country, and you have not been immunized against hepatitis B (hepatitis B vaccine).  You have HIV or AIDS.  You use needles to inject street drugs.  You live with someone who has hepatitis B.  You have had sex with someone who has hepatitis B.  You get hemodialysis treatment.  You take certain medicines for conditions, including cancer, organ transplantation, and autoimmune conditions. Hepatitis C  Blood testing is recommended for:  Everyone born from [birth year] through 1963.  Anyone with known risk factors for hepatitis C. Sexually transmitted infections (STIs)  You should be screened for sexually transmitted infections (STIs) including gonorrhea and chlamydia if:  You are sexually active and are younger than 59 years of age.  You are older than 59 years of age and your health care  provider tells you that you are at risk for this type of infection.  Your sexual activity has changed since you were last screened and you are at an increased risk for chlamydia or gonorrhea. Ask your health care provider if you are at risk.  If you do not have HIV, but are at risk, it may be recommended that you take a prescription medicine daily to prevent HIV infection. This is called pre-exposure prophylaxis (PrEP). You are considered at risk if:  You are sexually active and do not regularly use condoms or know the HIV status of your partner(s).  You take drugs by injection.  You are sexually active with a partner who has HIV. Talk with your health care provider about whether you are at high risk of being infected with HIV. If you choose to begin PrEP, you should first be tested for HIV. You should then be tested every 3 months for as long as you are taking PrEP. Pregnancy  If you are premenopausal and you may become pregnant, ask your health care provider about preconception counseling.  If you may become pregnant, take 400 to 800 micrograms (mcg) of folic acid every day.  If you want to prevent pregnancy, talk to your health care provider about birth control (contraception). Osteoporosis and menopause  Osteoporosis is a disease in which the bones lose minerals and strength with aging. This can result in serious bone fractures. Your risk for osteoporosis can be identified using a bone density scan.  If you are 57 years of age or older, or if you are at risk for osteoporosis and fractures, ask your health care provider if you should be screened.  Ask your health care provider whether you should take a calcium or vitamin D supplement to lower your risk for osteoporosis.  Menopause may have certain physical symptoms and risks.  Hormone replacement therapy may reduce some of these symptoms and risks. Talk to your health care provider about whether hormone replacement therapy is right  for you. Follow  these instructions at home:  Schedule regular health, dental, and eye exams.  Stay current with your immunizations.  Do not use any tobacco products including cigarettes, chewing tobacco, or electronic cigarettes.  If you are pregnant, do not drink alcohol.  If you are breastfeeding, limit how much and how often you drink alcohol.  Limit alcohol intake to no more than 1 drink per day for nonpregnant women. One drink equals 12 ounces of beer, 5 ounces of wine, or 1 ounces of hard liquor.  Do not use street drugs.  Do not share needles.  Ask your health care provider for help if you need support or information about quitting drugs.  Tell your health care provider if you often feel depressed.  Tell your health care provider if you have ever been abused or do not feel safe at home. This information is not intended to replace advice given to you by your health care provider. Make sure you discuss any questions you have with your health care provider. Document Released: 05/24/2011 Document Revised: 04/15/2016 Document Reviewed: 08/12/2015  2017 Elsevier  IF you received an x-ray today, you will receive an invoice from [facility]. Please contact [facility] at [phone] with questions or concerns regarding your invoice.   IF you received labwork today, you will receive an invoice from [facility]. Please contact [facility] at [phone] with questions or concerns regarding your invoice.   Our billing staff will not be able to assist you with questions regarding bills from these companies.  You will be contacted with the lab results as soon as they are available. The fastest way to get your results is to activate your My Chart account. Instructions are located on the last page of this paperwork. If you have not heard from [place] regarding the results in 2 weeks, please contact this office.

## 2016-12-15 LAB — BASIC METABOLIC PANEL
BUN/Creatinine Ratio: 17 (ref 9–23)
CO2: 25 mmol/L (ref 18–29)
Creatinine, Ser: 0.72 mg/dL (ref 0.57–1.00)
GFR calc Af Amer: 106 mL/min/{1.73_m2} (ref >59)
GFR calc non Af Amer: 92 mL/min/{1.73_m2} (ref >59)
Potassium: 4.3 mmol/L (ref 3.5–5.2)
Sodium: 141 mmol/L (ref 134–144)

## 2016-12-15 LAB — CBC WITH DIFFERENTIAL/PLATELET
Basophils Absolute: 0.1 10*3/uL (ref 0.0–0.2)
Basos: 1 %
EOS (ABSOLUTE): 0.2 10*3/uL (ref 0.0–0.4)
Eos: 4 %
Hematocrit: 41.5 % (ref 34.0–46.6)
Hemoglobin: 14.4 g/dL (ref 11.1–15.9)
Immature Grans (Abs): 0 10*3/uL (ref 0.0–0.1)
Immature Granulocytes: 0 %
Lymphocytes Absolute: 2.4 10*3/uL (ref 0.7–3.1)
Lymphs: 36 %
MCH: 28.5 pg (ref 26.6–33.0)
MCHC: 34.7 g/dL (ref 31.5–35.7)
MCV: 82 fL (ref 79–97)
Monocytes Absolute: 0.4 10*3/uL (ref 0.1–0.9)
Monocytes: 6 %
Neutrophils Absolute: 3.6 10*3/uL (ref 1.4–7.0)
Neutrophils: 53 %
Platelets: 338 10*3/uL (ref 150–379)
RBC: 5.06 x10E6/uL (ref 3.77–5.28)
RDW: 13.1 % (ref 12.3–15.4)
WBC: 6.6 10*3/uL (ref 3.4–10.8)

## 2016-12-15 LAB — LIPID PANEL
Chol/HDL Ratio: 4.3 ratio (ref 0.0–4.4)
Cholesterol, Total: 209 mg/dL — ABNORMAL HIGH (ref 100–199)
HDL: 49 mg/dL (ref >39)
LDL Calculated: 139 mg/dL — ABNORMAL HIGH (ref 0–99)
Triglycerides: 106 mg/dL (ref 0–149)
VLDL Cholesterol Cal: 21 mg/dL (ref 5–40)

## 2016-12-15 LAB — HIV ANTIBODY (ROUTINE TESTING W REFLEX): HIV Screen 4th Generation wRfx: NONREACTIVE

## 2016-12-15 LAB — BASIC METABOLIC PANEL WITH GFR
BUN: 12 mg/dL (ref 6–24)
Calcium: 9.8 mg/dL (ref 8.7–10.2)
Chloride: 101 mmol/L (ref 96–106)
Glucose: 101 mg/dL — ABNORMAL HIGH (ref 65–99)

## 2016-12-16 LAB — PAP IG AND HPV HIGH-RISK
HPV, high-risk: NEGATIVE
PAP Smear Comment: 0

## 2016-12-25 ENCOUNTER — Encounter: Payer: Self-pay | Admitting: Physician Assistant

## 2016-12-25 NOTE — Progress Notes (Signed)
Labs are stable. Will advise LDL-lowering lifestyle changes. RTC in one year for annual exam.

## 2017-02-25 ENCOUNTER — Encounter: Payer: Self-pay | Admitting: Physician Assistant

## 2017-12-12 IMAGING — DX DG LUMBAR SPINE 2-3V
3 series · 3 of 3 positions shown · non-contrast
Comparison: October 05, 2015

CLINICAL DATA: Left low back pain for 1 week radiating to the
sacroiliac joint.

EXAM:
LUMBAR SPINE - 2-3 VIEW

[l-spine ap]
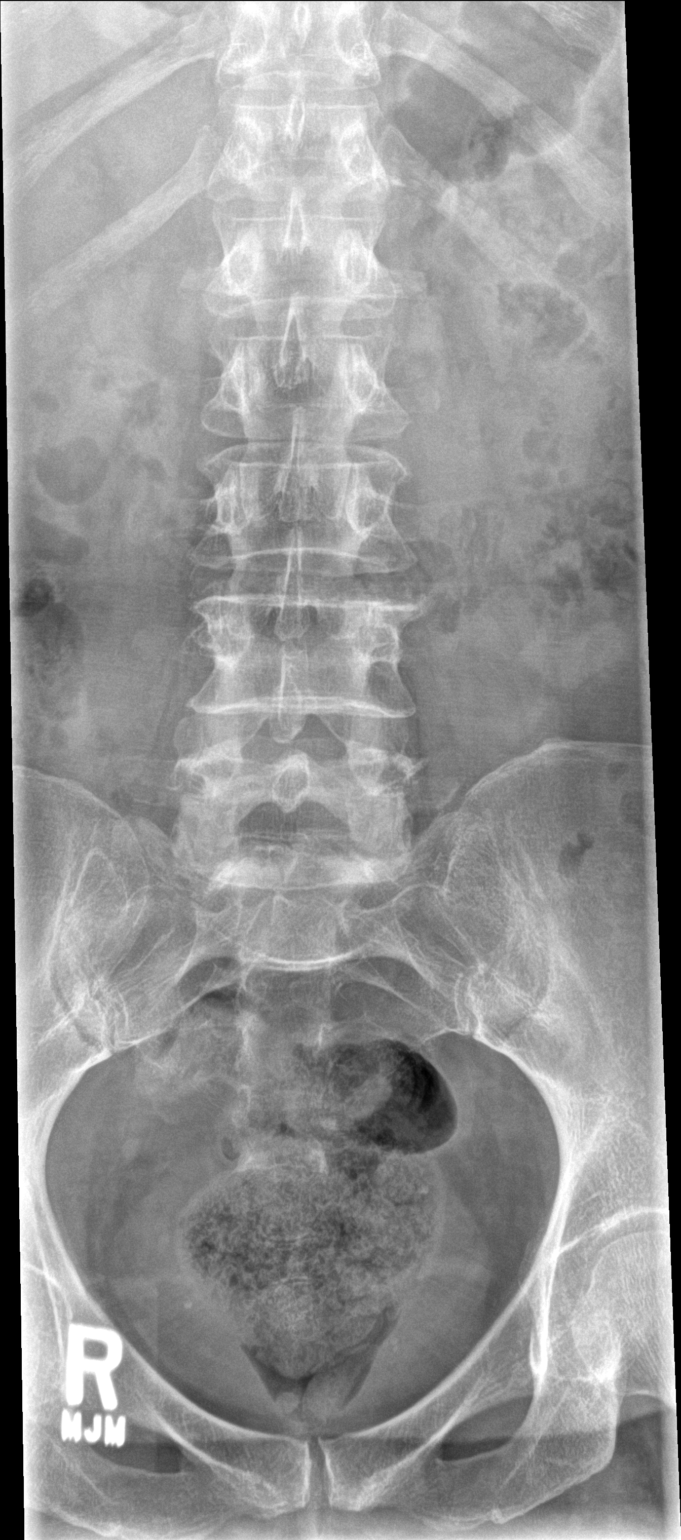

[l-spine lat]
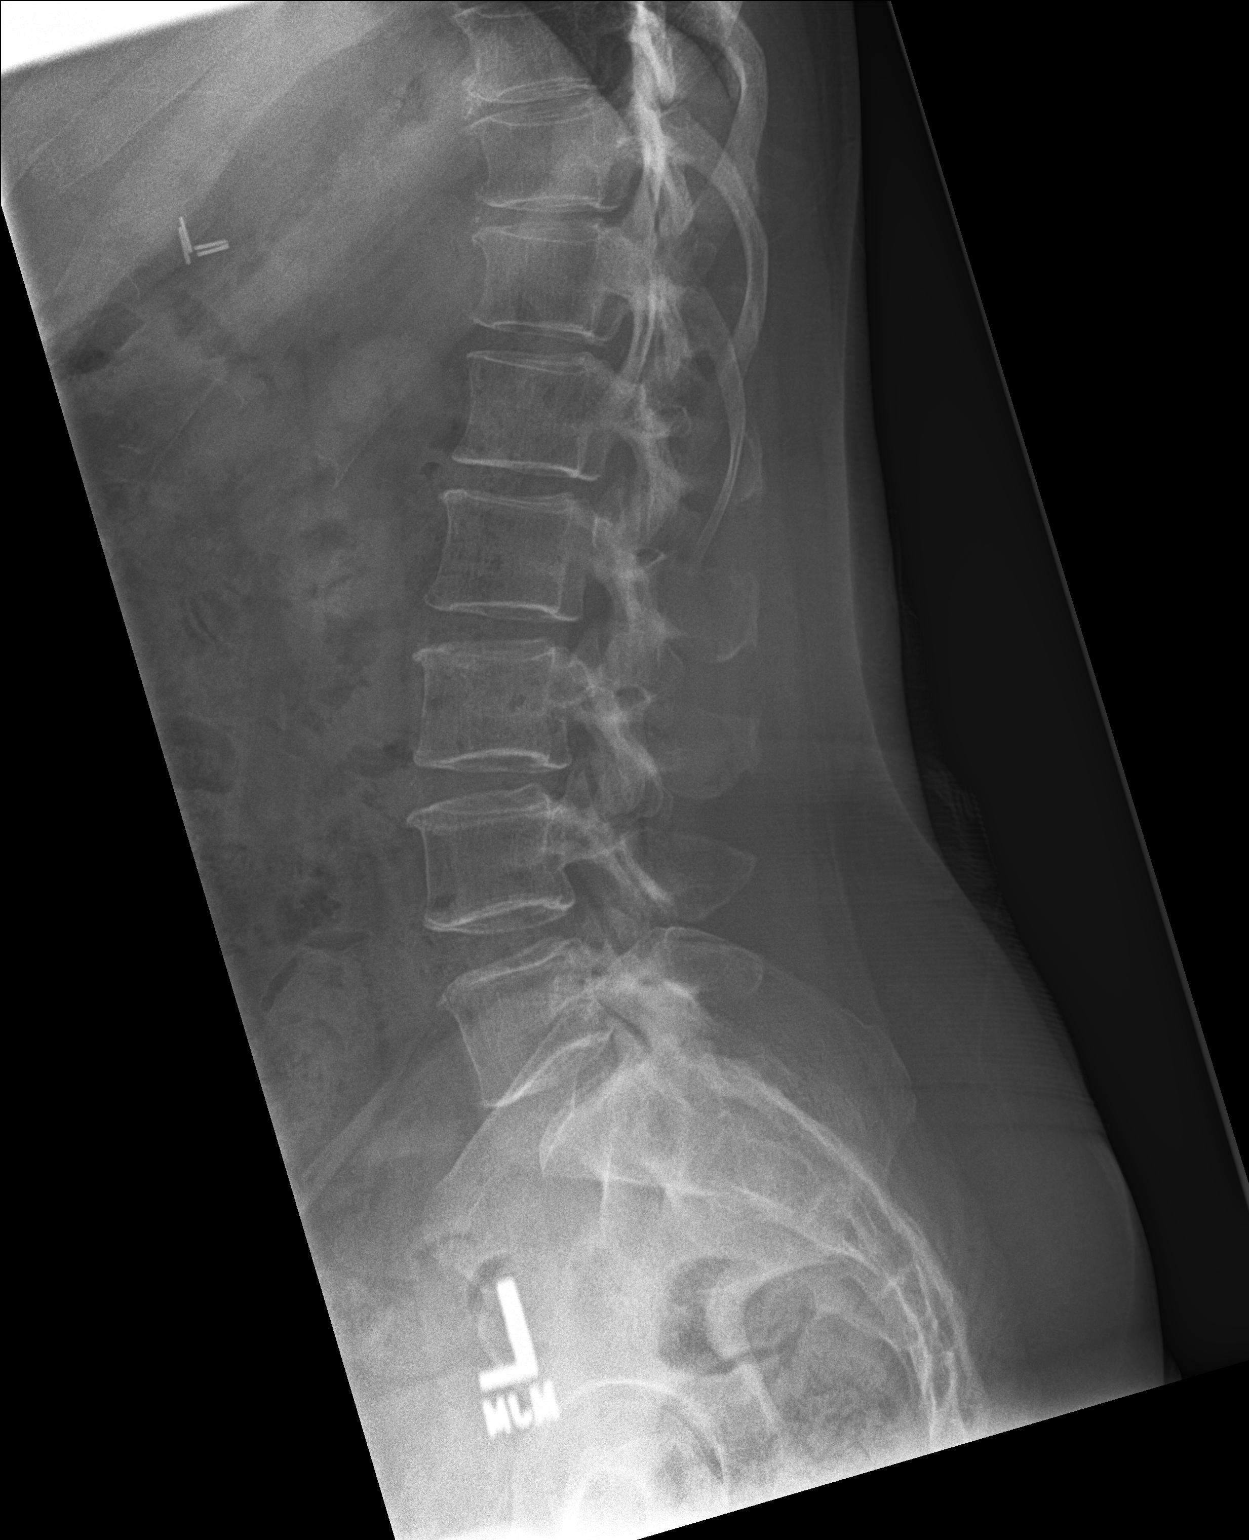

[l-spine l5-s1]
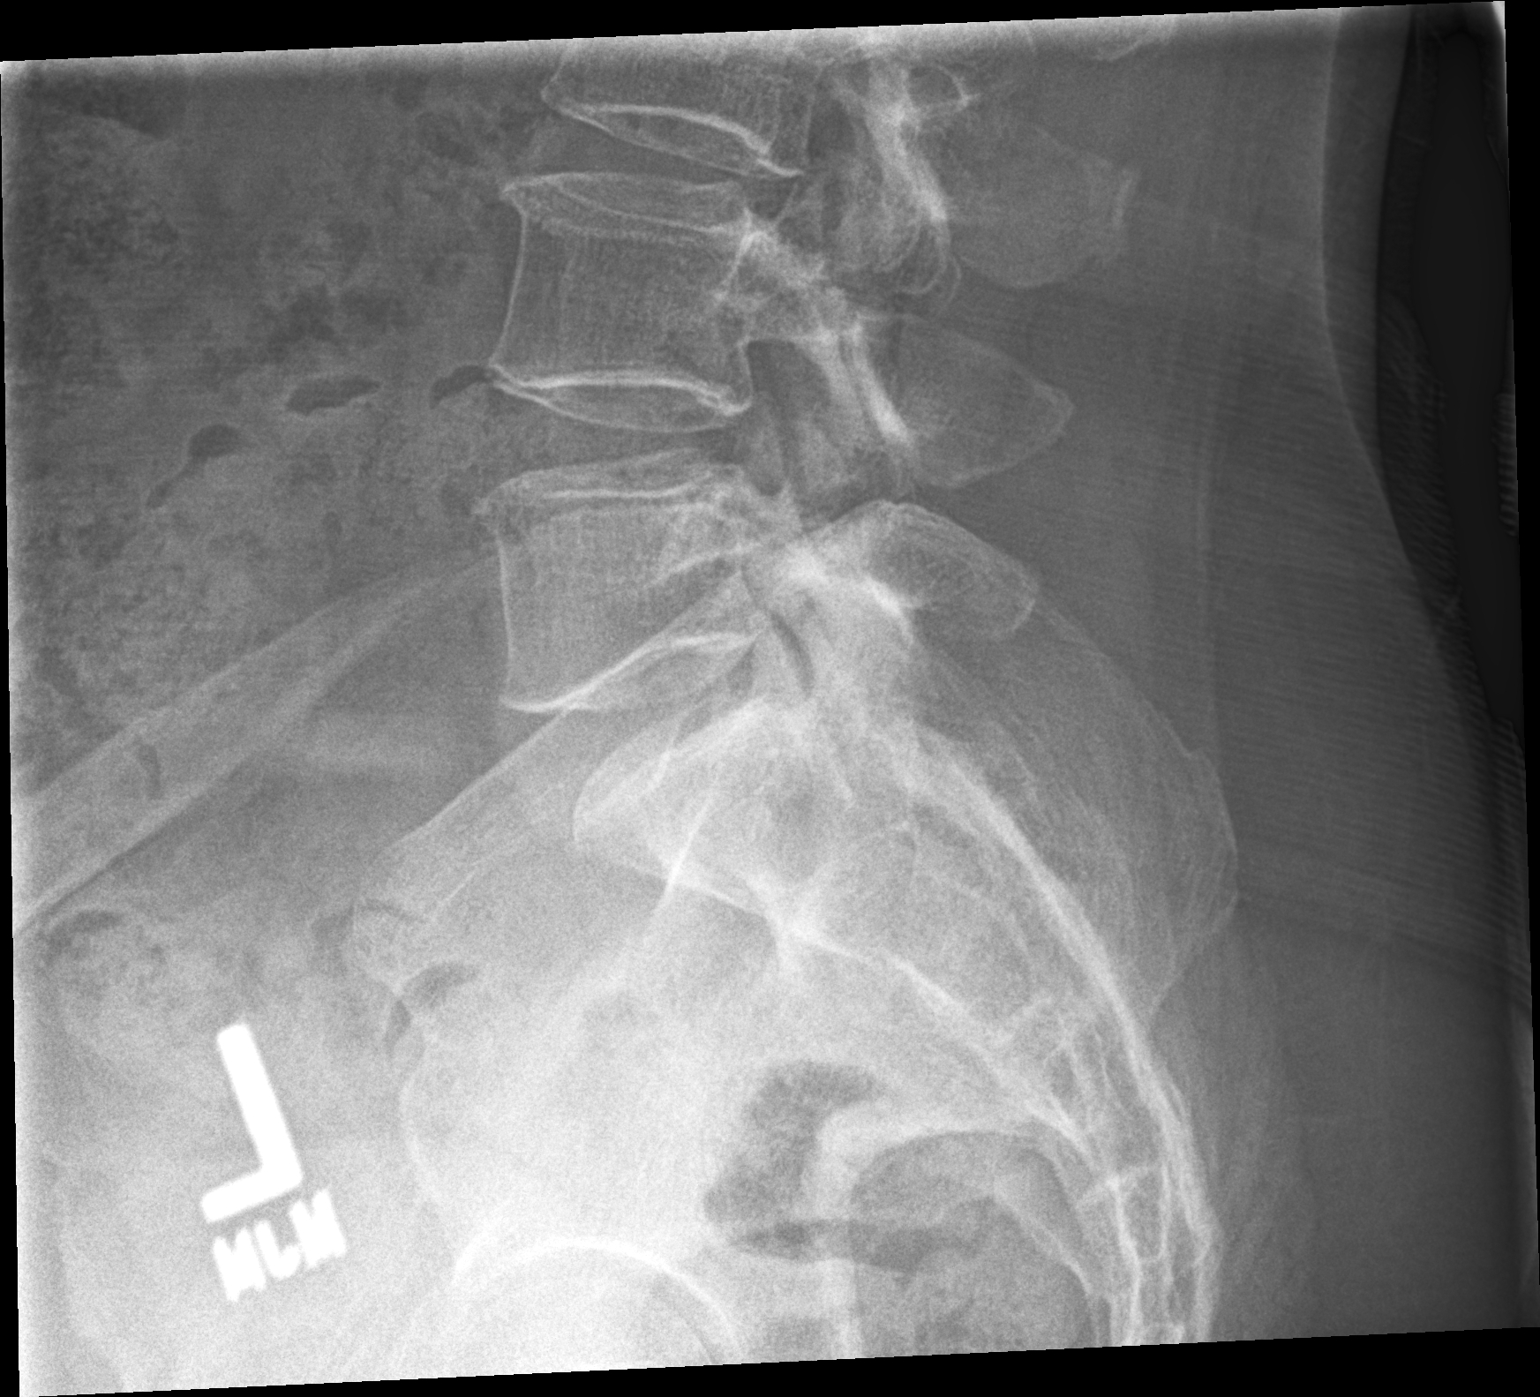

[3 of 3 positions shown; findings below may reference images not displayed]

FINDINGS: There is no evidence of lumbar spine fracture. Alignment is normal.
There mild degenerative joint changes throughout lumbar spine with
mild anterior osteophytosis
IMPRESSION: No acute fracture dislocation. Mild degenerative joint changes of
lumbar spine.

## 2017-12-28 ENCOUNTER — Other Ambulatory Visit: Payer: Self-pay

## 2017-12-28 ENCOUNTER — Encounter: Payer: Self-pay | Admitting: Physician Assistant

## 2017-12-28 ENCOUNTER — Ambulatory Visit: Payer: BLUE CROSS/BLUE SHIELD | Admitting: Physician Assistant

## 2017-12-28 VITALS — BP 118/78 | HR 87 | Temp 98.0°F | Resp 18 | Ht 60.5 in | Wt 152.4 lb

## 2017-12-28 DIAGNOSIS — Z1211 Encounter for screening for malignant neoplasm of colon: Secondary | ICD-10-CM

## 2017-12-28 DIAGNOSIS — Z23 Encounter for immunization: Secondary | ICD-10-CM | POA: Diagnosis not present

## 2017-12-28 DIAGNOSIS — I10 Essential (primary) hypertension: Secondary | ICD-10-CM

## 2017-12-28 DIAGNOSIS — Z1231 Encounter for screening mammogram for malignant neoplasm of breast: Secondary | ICD-10-CM

## 2017-12-28 MED ORDER — HYDROCHLOROTHIAZIDE 12.5 MG PO TABS: 12.5000 mg | ORAL_TABLET | Freq: Every day | ORAL | 3 refills | Status: DC

## 2017-12-28 NOTE — Progress Notes (Signed)
   Subjective:    Patient ID: Alyssa Walker, female    DOB: 10/14/1956, 62 y.o.   MRN: [id number]  Chief Complaint  Patient presents with  . Medication Refill    hydrodiuril    Overall, she feels well and has no complaints for today's visit. Her daughter accompanies to today's visit to translate.   Last visit was for an annual physical exam on 12/14/16 with PA McVey. Blood pressure was well controlled at that visit. Continues to take HCTZ as prescribed. No adverse effects from taking the medications.   Denies: headache, dizziness, nausea, vomiting, diarrhea, swelling, chest pain, or shortness of breath.   Referred at annual physical exam to GI for colonoscopy. She did not schedule due to difficulty finding a driver to her appointment.   Review of Systems As above  Patient Active Problem List   Diagnosis Date Noted  . DDD (degenerative disc disease), lumbar 10/13/2016  . HTN (hypertension) 02/21/2015  . Language barrier 02/21/2015   Prior to Admission medications   Medication Sig Start Date End Date Taking? Authorizing Provider  hydrochlorothiazide (HYDRODIURIL) 12.5 MG tablet Take 1 tablet (12.5 mg total) by mouth daily. 12/28/17  Yes Jeffery, Chelle, PA-C   No Known Allergies     Objective:   Physical Exam  Constitutional: She is oriented to person, place, and time. She appears well-developed and well-nourished.  HENT:  Head: Normocephalic.  Eyes: Conjunctivae are normal. No scleral icterus.  Neck: No JVD present. No thyromegaly present.  Cardiovascular: Normal rate, regular rhythm, normal heart sounds and intact distal pulses. Exam reveals no gallop and no friction rub.  No murmur heard. Pulses:      Radial pulses are 2+ on the right side, and 2+ on the left side.       Posterior tibial pulses are 2+ on the right side, and 2+ on the left side.  Pulmonary/Chest: Effort normal and breath sounds normal. No respiratory distress. She has no wheezes. She has no rales. She  exhibits no tenderness.  Musculoskeletal: She exhibits no edema.  Lymphadenopathy:    She has no cervical adenopathy.  Neurological: She is alert and oriented to person, place, and time.  Psychiatric: She has a normal mood and affect. Her behavior is normal.   Blood pressure 118/78, pulse 87, temperature 98 F (36.7 C), temperature source Oral, resp. rate 18, height 5' 0.5" (1.537 m), weight 152 lb 6.4 oz (69.1 kg), SpO2 99 %.     Assessment & Plan:  1. Essential hypertension Well controlled. Continue current medications. Await lab results. Will adjust treatment as indicated by results.  - Comprehensive metabolic panel - hydrochlorothiazide (HYDRODIURIL) 12.5 MG tablet; Take 1 tablet (12.5 mg total) by mouth daily.  Dispense: 90 tablet; Refill: 3  2. Need for prophylactic vaccination with combined diphtheria-tetanus-pertussis (DTP) vaccine - Tdap vaccine greater than or equal to 7yo IM  3. Encounter for screening mammogram for breast cancer - MM Digital Screening; Future  Instructed to call [facility] to schedule her colonoscopy. Her daughter agrees to take her to this appointment.   Return in about 6 months (around 06/27/2018) for re-evaluation of blood pressure with PA McVey.  Alfonse Alpersaroline Gloriajean Okun, PA-S

## 2017-12-28 NOTE — Progress Notes (Signed)
Patient ID: Alyssa CannyMARIA R Sisk, female    DOB: 01/27/1956, [age] y.o.   MRN: [id number]  PCP: Sebastian AcheMcVey, Elizabeth Whitney, PA-C  Chief Complaint  Patient presents with  . Medication Refill    hydrodiuril     Subjective:   Presents for evaluation of HTN. Her adult daughter is present and helps translate.  She feels generally very well, just needs a refill of hydrodiuril. BP has been well controlled. She tolerates the treatment without difficulty.  Denies CP, SOB, HA, dizziness, increased fatigue.  Has not scheduled colonoscopy because they needed a driver.  Review of Systems No chest pain, SOB, HA, dizziness, vision change, N/V, diarrhea, constipation, dysuria, urinary urgency or frequency, myalgias, arthralgias or rash.     Patient Active Problem List   Diagnosis Date Noted  . DDD (degenerative disc disease), lumbar 10/13/2016  . HTN (hypertension) 02/21/2015  . Language barrier 02/21/2015     Prior to Admission medications   Medication Sig Start Date End Date Taking? Authorizing Provider  hydrochlorothiazide (HYDRODIURIL) 12.5 MG tablet Take 1 tablet (12.5 mg total) by mouth daily. 12/14/16  Yes McVey, Madelaine BhatElizabeth Whitney, PA-C     No Known Allergies     Objective:  Physical Exam  Constitutional: She is oriented to person, place, and time. She appears well-developed and well-nourished. She is active and cooperative. No distress.  BP 118/78   Pulse 87   Temp 98 F (36.7 C) (Oral)   Resp 18   Ht 5' 0.5" (1.537 m)   Wt 152 lb 6.4 oz (69.1 kg)   SpO2 99%   BMI 29.27 kg/m   HENT:  Head: Normocephalic and atraumatic.  Right Ear: Hearing normal.  Left Ear: Hearing normal.  Eyes: Conjunctivae are normal. No scleral icterus.  Neck: Normal range of motion. Neck supple. No thyromegaly present.  Cardiovascular: Normal rate, regular rhythm and normal heart sounds.  Pulses:      Radial pulses are 2+ on the right side, and 2+ on the left side.  Pulmonary/Chest: Effort  normal and breath sounds normal.  Lymphadenopathy:       Head (right side): No tonsillar, no preauricular, no posterior auricular and no occipital adenopathy present.       Head (left side): No tonsillar, no preauricular, no posterior auricular and no occipital adenopathy present.    She has no cervical adenopathy.       Right: No supraclavicular adenopathy present.       Left: No supraclavicular adenopathy present.  Neurological: She is alert and oriented to person, place, and time. No sensory deficit.  Skin: Skin is warm, dry and intact. No rash noted. No cyanosis or erythema. Nails show no clubbing.  Psychiatric: She has a normal mood and affect. Her speech is normal and behavior is normal.      Assessment & Plan:   Problem List Items Addressed This Visit    HTN (hypertension) - Primary    COntrolled. No changes today.      Relevant Medications   hydrochlorothiazide (HYDRODIURIL) 12.5 MG tablet   Other Relevant Orders   Comprehensive metabolic panel (Completed)    Other Visit Diagnoses    Need for prophylactic vaccination with combined diphtheria-tetanus-pertussis (DTP) vaccine       Relevant Orders   Tdap vaccine greater than or equal to 7yo IM (Completed)   Encounter for screening mammogram for breast cancer       Relevant Orders   MM Digital Screening   Screen for  colon cancer       Provided the number to call and schedule. Her daughter can provide transportation.       Return in about 6 months (around 06/27/2018) for re-evaluation of blood pressure with PA McVey.   Fernande Bras, PA-C Primary Care at [facility]

## 2017-12-28 NOTE — Patient Instructions (Addendum)
1. Please arrange for a driver and call to schedule the colonoscopy! Call [facility] to schedule - [phone]    IF you received an x-ray today, you will receive an invoice from [facility]. Please contact [facility] at [phone] with questions or concerns regarding your invoice.   IF you received labwork today, you will receive an invoice from [facility]. Please contact [facility] at [phone] with questions or concerns regarding your invoice.   Our billing staff will not be able to assist you with questions regarding bills from these companies.  You will be contacted with the lab results as soon as they are available. The fastest way to get your results is to activate your My Chart account. Instructions are located on the last page of this paperwork. If you have not heard from us regarding the results in 2 weeks, please contact this office.

## 2017-12-29 LAB — COMPREHENSIVE METABOLIC PANEL
A/G RATIO: 1.3 (ref 1.2–2.2)
ALBUMIN: 4 g/dL (ref 3.6–4.8)
ALT: 25 IU/L (ref 0–32)
AST: 24 IU/L (ref 0–40)
Alkaline Phosphatase: 72 IU/L (ref 39–117)
BUN / CREAT RATIO: 15 (ref 12–28)
BUN: 11 mg/dL (ref 8–27)
Bilirubin Total: 0.2 mg/dL (ref 0.0–1.2)
CALCIUM: 9.6 mg/dL (ref 8.7–10.3)
CO2: 25 mmol/L (ref 20–29)
CREATININE: 0.75 mg/dL (ref 0.57–1.00)
Chloride: 103 mmol/L (ref 96–106)
GFR, EST AFRICAN AMERICAN: 100 mL/min/{1.73_m2} (ref >59)
GFR, EST NON AFRICAN AMERICAN: 87 mL/min/{1.73_m2} (ref >59)
GLOBULIN, TOTAL: 3.1 g/dL (ref 1.5–4.5)
Glucose: 122 mg/dL — ABNORMAL HIGH (ref 65–99)
POTASSIUM: 3.7 mmol/L (ref 3.5–5.2)
SODIUM: 142 mmol/L (ref 134–144)
Total Protein: 7.1 g/dL (ref 6.0–8.5)

## 2018-01-01 NOTE — Assessment & Plan Note (Signed)
COntrolled. No changes today. 

## 2018-01-22 ENCOUNTER — Encounter: Payer: Self-pay | Admitting: Physician Assistant

## 2018-01-22 DIAGNOSIS — R739 Hyperglycemia, unspecified: Secondary | ICD-10-CM | POA: Insufficient documentation

## 2018-06-27 ENCOUNTER — Ambulatory Visit: Payer: BLUE CROSS/BLUE SHIELD | Admitting: Physician Assistant

## 2019-01-17 ENCOUNTER — Other Ambulatory Visit: Payer: Self-pay | Admitting: Physician Assistant

## 2019-01-17 DIAGNOSIS — I10 Essential (primary) hypertension: Secondary | ICD-10-CM

## 2019-01-17 MED ORDER — HYDROCHLOROTHIAZIDE 12.5 MG PO TABS: 12.5000 mg | ORAL_TABLET | Freq: Every day | ORAL | 0 refills | Status: DC

## 2019-01-17 NOTE — Telephone Encounter (Signed)
Pt given enough tablets to get to appt 02/20/19 Requested Prescriptions  Pending Prescriptions Disp Refills  . hydrochlorothiazide (HYDRODIURIL) 12.5 MG tablet 34 tablet 0    Sig: Take 1 tablet (12.5 mg total) by mouth daily.     Cardiovascular: Diuretics - Thiazide Failed - 01/17/2019  3:45 PM      Failed - Ca in normal range and within 360 days    Calcium  Date Value Ref Range Status  12/28/2017 9.6 8.7 - 10.3 mg/dL Final         Failed - Cr in normal range and within 360 days    Creat  Date Value Ref Range Status  11/02/2015 0.59 0.50 - 1.05 mg/dL Final   Creatinine, Ser  Date Value Ref Range Status  12/28/2017 0.75 0.57 - 1.00 mg/dL Final         Failed - K in normal range and within 360 days    Potassium  Date Value Ref Range Status  12/28/2017 3.7 3.5 - 5.2 mmol/L Final         Failed - Na in normal range and within 360 days    Sodium  Date Value Ref Range Status  12/28/2017 142 134 - 144 mmol/L Final         Failed - Valid encounter within last 6 months    Recent Outpatient Visits          1 year ago Essential hypertension   Primary Care at [facility], [place], [place]   2 years ago Annual physical exam   Primary Care at [facility], Madelaine Bhat, PA-C   2 years ago Acute non intractable tension-type headache   Primary Care at [place], [place], [place]   2 years ago Left-sided low back pain without sciatica   Primary Care at [facility], Asencion Partridge, MD   2 years ago Spasm of back muscles   Primary Care at [facility], Harrel Lemon, MD      Future Appointments            In 1 month Myles Lipps, MD Primary Care at [facility], [facility]           Passed - Last BP in normal range    BP Readings from Last 1 Encounters:  12/28/17 118/78

## 2019-01-17 NOTE — Telephone Encounter (Signed)
Copied from CRM [id number]. Topic: Quick Communication - Rx Refill/Question >> Jan 17, 2019  3:41 PM Richarda Blade wrote: Medication: hydrochlorothiazide (HYDRODIURIL) 12.5 MG tablet   Has the patient contacted their pharmacy? No. (Agent: If no, request that the patient contact the pharmacy for the refill.) Completely out of Blood pressure medication   Preferred Pharmacy (with phone number or street name): [facility] [address] [place], [place] - [address] [phone] (Phone) [phone] (Fax)    Agent: Please be advised that RX refills may take up to 3 business days. We ask that you follow-up with your pharmacy.

## 2019-02-19 ENCOUNTER — Other Ambulatory Visit: Payer: Self-pay

## 2019-02-19 DIAGNOSIS — Z1211 Encounter for screening for malignant neoplasm of colon: Secondary | ICD-10-CM

## 2019-02-19 DIAGNOSIS — I10 Essential (primary) hypertension: Secondary | ICD-10-CM

## 2019-02-20 ENCOUNTER — Telehealth (INDEPENDENT_AMBULATORY_CARE_PROVIDER_SITE_OTHER): Payer: Self-pay | Admitting: Family Medicine

## 2019-02-20 ENCOUNTER — Other Ambulatory Visit: Payer: Self-pay

## 2019-02-20 DIAGNOSIS — I10 Essential (primary) hypertension: Secondary | ICD-10-CM

## 2019-02-20 MED ORDER — HYDROCHLOROTHIAZIDE 12.5 MG PO TABS: 12.5000 mg | ORAL_TABLET | Freq: Every day | ORAL | 0 refills | Status: AC

## 2019-02-20 NOTE — Progress Notes (Signed)
   Virtual Visit via telephone Note  I connected with patient on 02/20/19 at 909am by telephone and verified that I am speaking with the correct person using two identifiers. Alyssa Walker is currently located at home and patient is currently with her during visit. The provider, Myles Lipps, MD is located in their office at time of visit.  I discussed the limitations, risks, security and privacy concerns of performing an evaluation and management service by telephone and the availability of in person appointments. I also discussed with the patient that there may be a patient responsible charge related to this service. The patient expressed understanding and agreed to proceed.   Telephone visit today for HTN followup  HPI Previous PCP Tinnie Gens, PA-C Last OV feb 2019 Taking HCTZ as prescribed Checks BP at [facility], occasionally, reports readings normal? Has difficulty with transportation and language barriers illiterate in spanish Denies any chest pain, dizziness, palpitations, SOB, muscle cramps She is feeling well  Fall Risk  12/28/2017 10/13/2016  Falls in the past year? No No     Depression screen [facility] 2/9 12/28/2017 12/14/2016 10/13/2016  Decreased Interest 0 2 0  Down, Depressed, Hopeless 0 2 0  PHQ - 2 Score 0 4 0  Altered sleeping - 0 -  Tired, decreased energy - 0 -  Change in appetite - 2 -  Feeling bad or failure about yourself  - 0 -  Trouble concentrating - 0 -  Moving slowly or fidgety/restless - 0 -  Suicidal thoughts - 1 -  PHQ-9 Score - 7 -  Difficult doing work/chores - Somewhat difficult -    No Known Allergies  Prior to Admission medications   Medication Sig Start Date End Date Taking? Authorizing Provider  hydrochlorothiazide (HYDRODIURIL) 12.5 MG tablet Take 1 tablet (12.5 mg total) by mouth daily. 01/17/19   McVey, Madelaine Bhat, PA-C    Past Medical History:  Diagnosis Date  . Hypertension     Past Surgical History:  Procedure Laterality Date   . OVARIAN CYST REMOVAL Right 2000    Social History   Tobacco Use  . Smoking status: Never Smoker  . Smokeless tobacco: Never Used  Substance Use Topics  . Alcohol use: No    Alcohol/week: 0.0 standard drinks    No family history on file.  ROS  Per hpi  Objective   Vitals as reported by the patient: none  There were no vitals filed for this visit.  ASSESSMENT and PLAN  1. Essential hypertension - hydrochlorothiazide (HYDRODIURIL) 12.5 MG tablet; Take 1 tablet (12.5 mg total) by mouth daily.  FOLLOW-UP: this week for BMP and BP check, followup with me in 6 months   The above assessment and management plan was discussed with the patient. The patient verbalized understanding of and has agreed to the management plan. Patient is aware to call the clinic if symptoms persist or worsen. Patient is aware when to return to the clinic for a follow-up visit. Patient educated on when it is appropriate to go to the emergency department.    I provided 13 minutes of non-face-to-face time during this encounter.  Myles Lipps, MD Primary Care at [facility] [address] [place], [place] [address] Ph.  [phone] Fax [phone]
